# Patient Record
Sex: Female | Born: 1970 | Race: White | Hispanic: No | Marital: Single | Smoking: Former smoker
Health system: Southern US, Community
[De-identification: ages and names within clinical notes are randomized; demographics above are authoritative.]

## PROBLEM LIST (undated history)

## (undated) DIAGNOSIS — E785 Hyperlipidemia, unspecified: Secondary | ICD-10-CM

## (undated) DIAGNOSIS — M1990 Unspecified osteoarthritis, unspecified site: Secondary | ICD-10-CM

## (undated) DIAGNOSIS — J4520 Mild intermittent asthma, uncomplicated: Secondary | ICD-10-CM

## (undated) DIAGNOSIS — I10 Essential (primary) hypertension: Secondary | ICD-10-CM

## (undated) DIAGNOSIS — E1165 Type 2 diabetes mellitus with hyperglycemia: Secondary | ICD-10-CM

## (undated) HISTORY — DX: Type 2 diabetes mellitus with hyperglycemia: E11.65

## (undated) HISTORY — DX: Mild intermittent asthma, uncomplicated: J45.20

## (undated) HISTORY — PX: CHOLECYSTECTOMY: SHX55

---

## 1998-11-01 ENCOUNTER — Other Ambulatory Visit: Admission: RE | Admit: 1998-11-01 | Discharge: 1998-11-01 | Payer: Self-pay | Admitting: Family Medicine

## 2001-09-21 ENCOUNTER — Ambulatory Visit (HOSPITAL_COMMUNITY): Admission: RE | Admit: 2001-09-21 | Discharge: 2001-09-21 | Payer: Self-pay | Admitting: Obstetrics & Gynecology

## 2001-09-21 ENCOUNTER — Encounter: Payer: Self-pay | Admitting: Obstetrics & Gynecology

## 2002-12-02 ENCOUNTER — Other Ambulatory Visit: Admission: RE | Admit: 2002-12-02 | Discharge: 2002-12-02 | Payer: Self-pay | Admitting: Obstetrics & Gynecology

## 2008-03-06 ENCOUNTER — Emergency Department (HOSPITAL_COMMUNITY): Admission: EM | Admit: 2008-03-06 | Discharge: 2008-03-06 | Payer: Self-pay | Admitting: Emergency Medicine

## 2008-03-11 HISTORY — PX: ABDOMINAL SURGERY: SHX537

## 2008-04-15 ENCOUNTER — Ambulatory Visit (HOSPITAL_COMMUNITY): Admission: RE | Admit: 2008-04-15 | Discharge: 2008-04-15 | Payer: Self-pay | Admitting: Family Medicine

## 2008-12-01 ENCOUNTER — Ambulatory Visit (HOSPITAL_COMMUNITY): Admission: RE | Admit: 2008-12-01 | Discharge: 2008-12-01 | Payer: Self-pay | Admitting: Gastroenterology

## 2009-07-06 ENCOUNTER — Encounter: Admission: RE | Admit: 2009-07-06 | Discharge: 2009-07-06 | Payer: Self-pay | Admitting: Obstetrics & Gynecology

## 2010-06-08 ENCOUNTER — Other Ambulatory Visit: Payer: Self-pay | Admitting: Family Medicine

## 2010-06-08 DIAGNOSIS — Z1231 Encounter for screening mammogram for malignant neoplasm of breast: Secondary | ICD-10-CM

## 2010-07-13 ENCOUNTER — Ambulatory Visit
Admission: RE | Admit: 2010-07-13 | Discharge: 2010-07-13 | Disposition: A | Discharge status: Home / Self Care | Payer: PRIVATE HEALTH INSURANCE | Source: Ambulatory Visit | Attending: Family Medicine | Admitting: Family Medicine

## 2010-07-13 DIAGNOSIS — Z1231 Encounter for screening mammogram for malignant neoplasm of breast: Secondary | ICD-10-CM

## 2010-12-14 LAB — DIFFERENTIAL
Basophils Absolute: 0.1 10*3/uL (ref 0.0–0.1)
Basophils Relative: 1 % (ref 0–1)
Neutro Abs: 13.8 10*3/uL — ABNORMAL HIGH (ref 1.7–7.7)
Neutrophils Relative %: 76 % (ref 43–77)

## 2010-12-14 LAB — COMPREHENSIVE METABOLIC PANEL
Alkaline Phosphatase: 89 U/L (ref 39–117)
BUN: 7 mg/dL (ref 6–23)
Chloride: 104 mEq/L (ref 96–112)
Creatinine, Ser: 0.92 mg/dL (ref 0.4–1.2)
Glucose, Bld: 94 mg/dL (ref 70–99)
Potassium: 4.1 mEq/L (ref 3.5–5.1)
Total Bilirubin: 0.7 mg/dL (ref 0.3–1.2)

## 2010-12-14 LAB — LIPASE, BLOOD: Lipase: 23 U/L (ref 11–59)

## 2010-12-14 LAB — URINALYSIS, ROUTINE W REFLEX MICROSCOPIC
Glucose, UA: NEGATIVE mg/dL
Hgb urine dipstick: NEGATIVE
pH: 6 (ref 5.0–8.0)

## 2010-12-14 LAB — CBC
HCT: 42.5 % (ref 36.0–46.0)
Hemoglobin: 14.3 g/dL (ref 12.0–15.0)
MCV: 87.6 fL (ref 78.0–100.0)
RDW: 14 % (ref 11.5–15.5)

## 2010-12-14 LAB — GLUCOSE, CAPILLARY
Glucose-Capillary: 325 mg/dL — ABNORMAL HIGH (ref 70–99)
Glucose-Capillary: 325 mg/dL — ABNORMAL HIGH (ref 70–99)

## 2011-07-24 ENCOUNTER — Other Ambulatory Visit: Payer: Self-pay | Admitting: Obstetrics & Gynecology

## 2011-07-24 DIAGNOSIS — Z1231 Encounter for screening mammogram for malignant neoplasm of breast: Secondary | ICD-10-CM

## 2011-08-01 ENCOUNTER — Ambulatory Visit
Admission: RE | Admit: 2011-08-01 | Discharge: 2011-08-01 | Disposition: A | Discharge status: Home / Self Care | Payer: PRIVATE HEALTH INSURANCE | Source: Ambulatory Visit | Attending: Obstetrics & Gynecology | Admitting: Obstetrics & Gynecology

## 2011-08-01 DIAGNOSIS — Z1231 Encounter for screening mammogram for malignant neoplasm of breast: Secondary | ICD-10-CM

## 2012-09-25 ENCOUNTER — Other Ambulatory Visit: Payer: Self-pay

## 2012-09-25 DIAGNOSIS — Z1231 Encounter for screening mammogram for malignant neoplasm of breast: Secondary | ICD-10-CM

## 2012-10-12 ENCOUNTER — Ambulatory Visit
Admission: RE | Admit: 2012-10-12 | Discharge: 2012-10-12 | Disposition: A | Discharge status: Home / Self Care | Payer: PRIVATE HEALTH INSURANCE | Source: Ambulatory Visit

## 2012-10-12 DIAGNOSIS — Z1231 Encounter for screening mammogram for malignant neoplasm of breast: Secondary | ICD-10-CM

## 2013-01-02 ENCOUNTER — Emergency Department (HOSPITAL_BASED_OUTPATIENT_CLINIC_OR_DEPARTMENT_OTHER): Payer: PRIVATE HEALTH INSURANCE

## 2013-01-02 ENCOUNTER — Emergency Department (HOSPITAL_BASED_OUTPATIENT_CLINIC_OR_DEPARTMENT_OTHER)
Admission: EM | Admit: 2013-01-02 | Discharge: 2013-01-03 | Disposition: A | Discharge status: Home / Self Care | Payer: PRIVATE HEALTH INSURANCE | Attending: Emergency Medicine | Admitting: Emergency Medicine

## 2013-01-02 ENCOUNTER — Encounter (HOSPITAL_BASED_OUTPATIENT_CLINIC_OR_DEPARTMENT_OTHER): Payer: Self-pay | Admitting: Emergency Medicine

## 2013-01-02 DIAGNOSIS — Z79899 Other long term (current) drug therapy: Secondary | ICD-10-CM | POA: Insufficient documentation

## 2013-01-02 DIAGNOSIS — S8391XA Sprain of unspecified site of right knee, initial encounter: Secondary | ICD-10-CM

## 2013-01-02 DIAGNOSIS — Z880 Allergy status to penicillin: Secondary | ICD-10-CM | POA: Insufficient documentation

## 2013-01-02 DIAGNOSIS — I10 Essential (primary) hypertension: Secondary | ICD-10-CM | POA: Insufficient documentation

## 2013-01-02 DIAGNOSIS — Y9389 Activity, other specified: Secondary | ICD-10-CM | POA: Insufficient documentation

## 2013-01-02 DIAGNOSIS — Y929 Unspecified place or not applicable: Secondary | ICD-10-CM | POA: Insufficient documentation

## 2013-01-02 DIAGNOSIS — E785 Hyperlipidemia, unspecified: Secondary | ICD-10-CM | POA: Insufficient documentation

## 2013-01-02 DIAGNOSIS — M129 Arthropathy, unspecified: Secondary | ICD-10-CM | POA: Insufficient documentation

## 2013-01-02 DIAGNOSIS — IMO0002 Reserved for concepts with insufficient information to code with codable children: Secondary | ICD-10-CM | POA: Insufficient documentation

## 2013-01-02 DIAGNOSIS — X500XXA Overexertion from strenuous movement or load, initial encounter: Secondary | ICD-10-CM | POA: Insufficient documentation

## 2013-01-02 DIAGNOSIS — Z791 Long term (current) use of non-steroidal anti-inflammatories (NSAID): Secondary | ICD-10-CM | POA: Insufficient documentation

## 2013-01-02 HISTORY — DX: Hyperlipidemia, unspecified: E78.5

## 2013-01-02 HISTORY — DX: Essential (primary) hypertension: I10

## 2013-01-02 HISTORY — DX: Unspecified osteoarthritis, unspecified site: M19.90

## 2013-01-02 MED ORDER — HYDROCODONE-ACETAMINOPHEN 5-325 MG PO TABS: ORAL_TABLET | ORAL | Status: DC

## 2013-01-02 MED ORDER — ONDANSETRON 4 MG PO TBDP
ORAL_TABLET | ORAL | Status: AC
  Administered 2013-01-03: 4 mg
  Filled 2013-01-02: qty 1

## 2013-01-02 MED ORDER — PROMETHAZINE HCL 25 MG PO TABS: 25.0000 mg | ORAL_TABLET | Freq: Four times a day (QID) | ORAL | Status: DC | PRN

## 2013-01-02 MED ORDER — HYDROCODONE-ACETAMINOPHEN 5-325 MG PO TABS
2.0000 | ORAL_TABLET | Freq: Once | ORAL | Status: AC
  Administered 2013-01-02: 2 via ORAL
  Filled 2013-01-02: qty 2

## 2013-01-02 NOTE — ED Notes (Signed)
Patient was reluctant to move from chair to bed. I explained need for her on bed to form fit the immobilizer to her leg. I got her to agree, then tried both small and large sizes. I convinced patient to go with the larger device. I formed the "stay bars" to her leg's contour, then cut foam padding to a perfect fit. Patient is resting in bed, waiting on x-ray, she is requesting my assistance at time of discharge.

## 2013-01-02 NOTE — Discharge Instructions (Signed)
 Knee Pain The knee is the complex joint between your thigh and your lower leg. It is made up of bones, tendons, ligaments, and cartilage. The bones that make up the knee are:  The femur in the thigh.  The tibia and fibula in the lower leg.  The patella or kneecap riding in the groove on the lower femur. CAUSES  Knee pain is a common complaint with many causes. A few of these causes are:  Injury, such as:  A ruptured ligament or tendon injury.  Torn cartilage.  Medical conditions, such as:  Gout  Arthritis  Infections  Overuse, over training or overdoing a physical activity. Knee pain can be minor or severe. Knee pain can accompany debilitating injury. Minor knee problems often respond well to self-care measures or get well on their own. More serious injuries may need medical intervention or even surgery. SYMPTOMS The knee is complex. Symptoms of knee problems can vary widely. Some of the problems are:  Pain with movement and weight bearing.  Swelling and tenderness.  Buckling of the knee.  Inability to straighten or extend your knee.  Your knee locks and you cannot straighten it.  Warmth and redness with pain and fever.  Deformity or dislocation of the kneecap. DIAGNOSIS  Determining what is wrong may be very straight forward such as when there is an injury. It can also be challenging because of the complexity of the knee. Tests to make a diagnosis may include:  Your caregiver taking a history and doing a physical exam.  Routine X-rays can be used to rule out other problems. X-rays will not reveal a cartilage tear. Some injuries of the knee can be diagnosed by:  Arthroscopy a surgical technique by which a small video camera is inserted through tiny incisions on the sides of the knee. This procedure is used to examine and repair internal knee joint problems. Tiny instruments can be used during arthroscopy to repair the torn knee cartilage (meniscus).  Arthrography  is a radiology technique. A contrast liquid is directly injected into the knee joint. Internal structures of the knee joint then become visible on X-ray film.  An MRI scan is a non x-ray radiology procedure in which magnetic fields and a computer produce two- or three-dimensional images of the inside of the knee. Cartilage tears are often visible using an MRI scanner. MRI scans have largely replaced arthrography in diagnosing cartilage tears of the knee.  Blood work.  Examination of the fluid that helps to lubricate the knee joint (synovial fluid). This is done by taking a sample out using a needle and a syringe. TREATMENT The treatment of knee problems depends on the cause. Some of these treatments are:  Depending on the injury, proper casting, splinting, surgery or physical therapy care will be needed.  Give yourself adequate recovery time. Do not overuse your joints. If you begin to get sore during workout routines, back off. Slow down or do fewer repetitions.  For repetitive activities such as cycling or running, maintain your strength and nutrition.  Alternate muscle groups. For example if you are a weight lifter, work the upper body on one day and the lower body the next.  Either tight or weak muscles do not give the proper support for your knee. Tight or weak muscles do not absorb the stress placed on the knee joint. Keep the muscles surrounding the knee strong.  Take care of mechanical problems.  If you have flat feet, orthotics or special shoes may help.  See your caregiver if you need help.  Arch supports, sometimes with wedges on the inner or outer aspect of the heel, can help. These can shift pressure away from the side of the knee most bothered by osteoarthritis.  A brace called an unloader brace also may be used to help ease the pressure on the most arthritic side of the knee.  If your caregiver has prescribed crutches, braces, wraps or ice, use as directed. The acronym for  this is PRICE. This means protection, rest, ice, compression and elevation.  Nonsteroidal anti-inflammatory drugs (NSAID's), can help relieve pain. But if taken immediately after an injury, they may actually increase swelling. Take NSAID's with food in your stomach. Stop them if you develop stomach problems. Do not take these if you have a history of ulcers, stomach pain or bleeding from the bowel. Do not take without your caregiver's approval if you have problems with fluid retention, heart failure, or kidney problems.  For ongoing knee problems, physical therapy may be helpful.  Glucosamine and chondroitin are over-the-counter dietary supplements. Both may help relieve the pain of osteoarthritis in the knee. These medicines are different from the usual anti-inflammatory drugs. Glucosamine may decrease the rate of cartilage destruction.  Injections of a corticosteroid drug into your knee joint may help reduce the symptoms of an arthritis flare-up. They may provide pain relief that lasts a few months. You may have to wait a few months between injections. The injections do have a small increased risk of infection, water retention and elevated blood sugar levels.  Hyaluronic acid injected into damaged joints may ease pain and provide lubrication. These injections may work by reducing inflammation. A series of shots may give relief for as long as 6 months.  Topical painkillers. Applying certain ointments to your skin may help relieve the pain and stiffness of osteoarthritis. Ask your pharmacist for suggestions. Many over the-counter products are approved for temporary relief of arthritis pain.  In some countries, doctors often prescribe topical NSAID's for relief of chronic conditions such as arthritis and tendinitis. A review of treatment with NSAID creams found that they worked as well as oral medications but without the serious side effects. PREVENTION  Maintain a healthy weight. Extra pounds put  more strain on your joints.  Get strong, stay limber. Weak muscles are a common cause of knee injuries. Stretching is important. Include flexibility exercises in your workouts.  Be smart about exercise. If you have osteoarthritis, chronic knee pain or recurring injuries, you may need to change the way you exercise. This does not mean you have to stop being active. If your knees ache after jogging or playing basketball, consider switching to swimming, water aerobics or other low-impact activities, at least for a few days a week. Sometimes limiting high-impact activities will provide relief.  Make sure your shoes fit well. Choose footwear that is right for your sport.  Protect your knees. Use the proper gear for knee-sensitive activities. Use kneepads when playing volleyball or laying carpet. Buckle your seat belt every time you drive. Most shattered kneecaps occur in car accidents.  Rest when you are tired. SEEK MEDICAL CARE IF:  You have knee pain that is continual and does not seem to be getting better.  SEEK IMMEDIATE MEDICAL CARE IF:  Your knee joint feels hot to the touch and you have a high fever. MAKE SURE YOU:   Understand these instructions.  Will watch your condition.  Will get help right away if you are not  doing well or get worse. Document Released: 12/23/2006 Document Revised: 05/20/2011 Document Reviewed: 12/23/2006 [facility] Patient Information 2014 [place], [place].     Narcotic and benzodiazepine use may cause drowsiness, slowed breathing or dependence.  Please use with caution and do not drive, operate machinery or watch young children alone while taking them.  Taking combinations of these medications or drinking alcohol will potentiate these effects.

## 2013-01-02 NOTE — ED Notes (Signed)
Pt report fall stepping over boxes and twisted right knee and right buttocks pain radiates  down back off buttock to lower leg

## 2013-01-02 NOTE — ED Provider Notes (Signed)
CSN: [id number]     Arrival date & time 01/02/13  1928 History  This chart was scribed for Monique Fisher. Oletta Lamas, MD by Danella Maiers, ED Scribe. This patient was seen in room MH06/MH06 and the patient's care was started at 9:32 PM.   Chief Complaint  Patient presents with  . Knee Injury   The history is provided by the patient. No language interpreter was used.   HPI Comments: Monique Fisher is a 42 y.o. female who presents to the Emergency Department complaining of constant, sudden-onset right knee pain after catching herself from falling while stepping over storage boxes and twisting her right knee around 11am this morning. She was unable to bear weight on her right leg imemdiately after the accident. Sine then she has been able to limp with occasional buckling of the right leg. She denies hitting her head, LOC, injury anywhere else.  Pt had not taken any specific medications prior to arrival.  Bending, twisting leg causes pain to worsen as does trying to bear weight.    Past Medical History  Diagnosis Date  . Hypertension   . Hyperlipemia   . Arthritis    Past Surgical History  Procedure Laterality Date  . Abdominal surgery    . Cholecystectomy     History reviewed. No pertinent family history. History  Substance Use Topics  . Smoking status: Never Smoker   . Smokeless tobacco: Not on file  . Alcohol Use: No   OB History   Grav Para Term Preterm Abortions TAB SAB Ect Mult Living                 Review of Systems  Musculoskeletal: Positive for arthralgias (right knee) and joint swelling. Negative for back pain.  Skin: Negative for color change and wound.  Neurological: Negative for weakness and numbness.    Allergies  Iohexol; Penicillins; and Sulfa antibiotics  Home Medications   Current Outpatient Rx  Name  Route  Sig  Dispense  Refill  . cholecalciferol (VITAMIN D) 1000 UNITS tablet   Oral   Take 2,000 Units by mouth daily.         . fenofibrate 54 MG tablet  Oral   Take 54 mg by mouth daily.         Marland Kitchen gabapentin (NEURONTIN) 300 MG capsule   Oral   Take 300 mg by mouth 2 (two) times daily with a meal.         . losartan (COZAAR) 100 MG tablet   Oral   Take 100 mg by mouth daily.         . meloxicam (MOBIC) 15 MG tablet   Oral   Take 15 mg by mouth daily.         . metoprolol succinate (TOPROL-XL) 50 MG 24 hr tablet   Oral   Take 50 mg by mouth daily. Take with or immediately following a meal.         . montelukast (SINGULAIR) 10 MG tablet   Oral   Take 10 mg by mouth at bedtime.         . Multiple Vitamins-Minerals (MULTIVITAMIN WITH MINERALS) tablet   Oral   Take 1 tablet by mouth daily.         Marland Kitchen OLANZapine (ZYPREXA) 10 MG tablet   Oral   Take 10 mg by mouth at bedtime.         Marland Kitchen omeprazole (PRILOSEC) 20 MG capsule   Oral   Take 20 mg  by mouth daily.         Marland Kitchen pyridOXINE (VITAMIN B-6) 100 MG tablet   Oral   Take 100 mg by mouth daily.         . vitamin B-12 (CYANOCOBALAMIN) 1000 MCG tablet   Oral   Take 1,000 mcg by mouth daily.         Marland Kitchen HYDROcodone-acetaminophen (NORCO/VICODIN) 5-325 MG per tablet      1-2 tablets po q 6 hours prn moderate to severe pain   20 tablet   0    BP 151/77  Pulse 98  Temp(Src) 98.3 F (36.8 C) (Oral)  Resp 20  Ht 5\' 3"  (1.6 m)  Wt 304 lb (137.893 kg)  BMI 53.86 kg/m2  SpO2 98% Physical Exam  Nursing note and vitals reviewed. Constitutional: She is oriented to person, place, and time. She appears well-developed and well-nourished. No distress.  HENT:  Head: Normocephalic and atraumatic.  Eyes: EOM are normal.  Neck: Neck supple. No tracheal deviation present.  Cardiovascular: Normal rate and regular rhythm.   Pulmonary/Chest: Effort normal. No respiratory distress.  Abdominal: Soft.  Musculoskeletal:       Right knee: She exhibits decreased range of motion. She exhibits no effusion, no ecchymosis, no deformity, no laceration, no erythema, normal  alignment, no LCL laxity, normal patellar mobility and no bony tenderness. Tenderness found. Medial joint line and lateral joint line tenderness noted. No patellar tendon tenderness noted.  quadriceps and hamstrings intact although pain with extending the knee. Ligaments in all four directions seem stable. Limited by her body habitus.  Neurological: She is alert and oriented to person, place, and time.  Skin: Skin is warm and dry. No lesion and no rash noted. She is not diaphoretic. No cyanosis or erythema. No pallor.  Color was normal.  Psychiatric: She has a normal mood and affect. Her behavior is normal.    ED Course  Procedures (including critical care time) Medications  HYDROcodone-acetaminophen (NORCO/VICODIN) 5-325 MG per tablet 2 tablet (2 tablets Oral Given 01/02/13 2214)    DIAGNOSTIC STUDIES: Oxygen Saturation is 98% on RA, normal by my interpretation.    COORDINATION OF CARE: 9:53 PM- Discussed treatment plan with pt which includes x-rays, knee immobilizer, and pain medication. Pt agrees to plan.    Labs Review Labs Reviewed - No data to display Imaging Review Dg Knee Complete 4 Views Right  01/02/2013   CLINICAL DATA:  Knee injury. Twisted knee. Pain inferior to the patella.  EXAM: RIGHT KNEE - COMPLETE 4+ VIEW  COMPARISON:  None.  FINDINGS: The alignment of the knee is anatomic. There is no acute fracture are identified. There is an old avulsion of the tip of a fibular head. This appears well corticated. Knee effusion is difficult to evaluate because of body habitus. Patellar tendon shadow appears within normal limits. Mild patellofemoral osteoarthritis is present with marginal osteophytes.  IMPRESSION: No acute osseous abnormality.   Electronically Signed   By: Andreas Newport M.D.   On: 01/02/2013 23:13    EKG Interpretation   None       MDM   1. Knee sprain and strain, right, initial encounter      I personally performed the services described in this  documentation, which was scribed in my presence. The recorded information has been reviewed and considered.  Pt with twisting injury to knee, possibly ligamentous injury.  PT can bear some weight so doubt there is fracture.  Will get plain films, treat with  RICE, Rx for analgesics, refer to ortho for follow up, crutcehs, knee immobilizer  Monique Fisher. Oletta Lamas, MD 01/02/13 2330

## 2013-01-03 NOTE — ED Notes (Signed)
Pt c/o feeling nauseous, Dr. Oletta Lamas notified and order received for Zofran 4 mg ODT.

## 2013-11-04 DIAGNOSIS — G5710 Meralgia paresthetica, unspecified lower limb: Secondary | ICD-10-CM | POA: Insufficient documentation

## 2013-11-04 DIAGNOSIS — G5600 Carpal tunnel syndrome, unspecified upper limb: Secondary | ICD-10-CM | POA: Insufficient documentation

## 2014-06-07 ENCOUNTER — Other Ambulatory Visit: Payer: Self-pay

## 2014-06-07 DIAGNOSIS — Z1231 Encounter for screening mammogram for malignant neoplasm of breast: Secondary | ICD-10-CM

## 2014-06-09 ENCOUNTER — Ambulatory Visit: Payer: PRIVATE HEALTH INSURANCE

## 2014-06-21 ENCOUNTER — Ambulatory Visit
Admission: RE | Admit: 2014-06-21 | Discharge: 2014-06-21 | Disposition: A | Discharge status: Home / Self Care | Payer: PRIVATE HEALTH INSURANCE | Source: Ambulatory Visit

## 2014-06-21 DIAGNOSIS — Z1231 Encounter for screening mammogram for malignant neoplasm of breast: Secondary | ICD-10-CM

## 2014-06-22 ENCOUNTER — Other Ambulatory Visit: Payer: Self-pay | Admitting: Obstetrics & Gynecology

## 2014-06-22 DIAGNOSIS — R928 Other abnormal and inconclusive findings on diagnostic imaging of breast: Secondary | ICD-10-CM

## 2014-06-24 ENCOUNTER — Ambulatory Visit
Admission: RE | Admit: 2014-06-24 | Discharge: 2014-06-24 | Disposition: A | Discharge status: Home / Self Care | Payer: PRIVATE HEALTH INSURANCE | Source: Ambulatory Visit | Attending: Obstetrics & Gynecology | Admitting: Obstetrics & Gynecology

## 2014-06-24 DIAGNOSIS — R928 Other abnormal and inconclusive findings on diagnostic imaging of breast: Secondary | ICD-10-CM

## 2015-01-05 ENCOUNTER — Other Ambulatory Visit: Payer: Self-pay | Admitting: Allergy and Immunology

## 2015-01-12 ENCOUNTER — Encounter: Payer: Self-pay | Admitting: Allergy and Immunology

## 2015-01-12 ENCOUNTER — Ambulatory Visit (INDEPENDENT_AMBULATORY_CARE_PROVIDER_SITE_OTHER): Payer: BLUE CROSS/BLUE SHIELD | Admitting: Allergy and Immunology

## 2015-01-12 VITALS — BP 114/66 | HR 76 | Resp 16

## 2015-01-12 DIAGNOSIS — R062 Wheezing: Secondary | ICD-10-CM | POA: Diagnosis not present

## 2015-01-12 DIAGNOSIS — H1010 Acute atopic conjunctivitis, unspecified eye: Secondary | ICD-10-CM | POA: Diagnosis not present

## 2015-01-12 DIAGNOSIS — R05 Cough: Secondary | ICD-10-CM

## 2015-01-12 DIAGNOSIS — J309 Allergic rhinitis, unspecified: Secondary | ICD-10-CM

## 2015-01-12 DIAGNOSIS — R059 Cough, unspecified: Secondary | ICD-10-CM

## 2015-01-12 MED ORDER — MONTELUKAST SODIUM 10 MG PO TABS: ORAL_TABLET | ORAL | Status: DC

## 2015-01-12 NOTE — Progress Notes (Signed)
FOLLOW UP NOTE  RE: Gursimran Litaker MRN: [id number] DOB: [birth year] [facility] [place] [address] [place] [address] Date of Office Visit: 01/12/2015  Subjective:  Monique Fisher is a 44 y.o. female who presents today follow-up of nasal symptoms and recent UC visit.   HPI: Lashon returns to office in follow-up of allergic rhinoconjunctivitis, cough, and food allergy. Since her last visit,  she feels maintenance medications are working well for her, though she had upper respiratory infection like symptoms of cough, congestion.  throat irritation, and postnasal drip, after getting stuck in the rain for 2 hours.  She managed at home initially with Delsym, which did not seem beneficial and about day 5 saw primary M.D. for chest congestion, wheezing at the walk-in clinic.  They prescribed Levaquin, a cough syrup and 3 days of Pro Air use.  Two days prior to her symptoms, she had evaluation with neurology related to arm, hand numbness and associated carpal tunnel issue, which was treated with prednisone.  (One of her concerns was the acute symptoms while taking prednisone).  She currently feels she is 100% for over a week now.  Current Medications: 1.  Pro Air HFA as needed. 2.  Cetirizine 10 mg daily. 3.  Singulair 10 mg daily. 4.  Benadryl/EpiPen as needed. 5.  Saline nasal lavage as needed. 6.  Pataday and Nasonex as needed (none recently). 7.  Continues norethindrone, Toprol-XL, glucosamine chondroitin, Prilosec, fenofibrate, gabapentin, losartan, culturelle probiotic, multivitamin, vitamin B-12, vitamin D and B6.  Drug Allergies: Allergies  Allergen Reactions  . Amlodipine     Other reaction(s): NO REACTION  . Amoxicillin   . Erythromycin     "SKIN CRAWLING"  . Fluticasone   . Iohexol      Code: HIVES, Desc: pt states hive w/ct contrast, does fine w/13hr prep, Onset Date: [id number]  Code: HIVES, Desc: pt states hive w/ct contrast,  does fine w/13hr prep, Onset Date: [id number]   . Latex   . Lisinopril     NIGHT SWEATS, COUGH   . Penicillins   . Phentermine Hypertension  . Shellfish-Derived Products   . Sulfa Antibiotics     Objective:   Filed Vitals:   01/12/15 1037  BP: 114/66  Pulse: 76  Resp: 16   Physical Exam  Constitutional: She is well-developed, well-nourished, and in no distress.  HENT:  Head: Atraumatic.  Right Ear: Tympanic membrane and ear canal normal.  Left Ear: Tympanic membrane and ear canal normal.  Nose: Mucosal edema present. No rhinorrhea. No epistaxis.  Mouth/Throat: Oropharynx is clear and moist and mucous membranes are normal. No oropharyngeal exudate, posterior oropharyngeal edema or posterior oropharyngeal erythema.  Neck: Neck supple.  Cardiovascular: Normal rate, S1 normal and S2 normal.   No murmur heard. Pulmonary/Chest: Effort normal. She has no wheezes. She has no rhonchi. She has no rales.  Lymphadenopathy:    She has no cervical adenopathy.    Diagnostics: FVC 2.87--93%, FEV1 2.46--93%.  Assessment:  1.  Allergic rhinoconjunctivitis, well controlled from recent exacerbation. 2.  Patient report of recent respiratory infection with wheezing status post Levaquin and ProAir. 3.  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 4.  Complex medical history on multiple medication regime. Plan:  1.  Nachelle will continue her current medication regime is working well for her. 2.  Follow-up in 6 months or sooner if needed. 3.  She will follow up earlier with neurology as discussed.  Roselyn M. Willa RoughHicks, MD  cc: Dario Guardianandace T. Smith, MD

## 2015-06-03 ENCOUNTER — Other Ambulatory Visit: Payer: Self-pay | Admitting: Allergy and Immunology

## 2015-06-09 ENCOUNTER — Other Ambulatory Visit: Payer: Self-pay

## 2015-06-09 MED ORDER — EPINEPHRINE 0.3 MG/0.3ML IJ SOAJ: INTRAMUSCULAR | Status: DC

## 2015-06-09 MED ORDER — EPINEPHRINE 0.3 MG/0.3ML IJ SOAJ
INTRAMUSCULAR | Status: DC
Start: 2015-06-09 — End: 2015-06-09

## 2015-06-09 NOTE — Telephone Encounter (Signed)
Patient called states has coupon to get Epipen brand name for free. Sent in to pharmacy.

## 2015-06-09 NOTE — Telephone Encounter (Signed)
Pt would like the Brand name Epi Pen sent in, instead of the generic. She would  like to use the brand name coupon.   [facility] [address] [facility] in [place]   Please Advise  DOL Visit 01/2015 HICKS

## 2015-06-09 NOTE — Addendum Note (Signed)
Addended by: Bennye AlmMIRANDA, Dru Laurel on: 06/09/2015 03:31 PM   Modules accepted: Orders

## 2015-07-13 ENCOUNTER — Ambulatory Visit: Payer: BLUE CROSS/BLUE SHIELD | Admitting: Allergy and Immunology

## 2015-07-17 ENCOUNTER — Other Ambulatory Visit: Payer: Self-pay

## 2015-07-17 DIAGNOSIS — Z1231 Encounter for screening mammogram for malignant neoplasm of breast: Secondary | ICD-10-CM

## 2015-07-28 ENCOUNTER — Encounter: Payer: Self-pay | Admitting: Allergy and Immunology

## 2015-07-28 ENCOUNTER — Ambulatory Visit
Admission: RE | Admit: 2015-07-28 | Discharge: 2015-07-28 | Disposition: A | Discharge status: Home / Self Care | Payer: Managed Care, Other (non HMO) | Source: Ambulatory Visit

## 2015-07-28 ENCOUNTER — Other Ambulatory Visit: Payer: Self-pay

## 2015-07-28 ENCOUNTER — Ambulatory Visit (INDEPENDENT_AMBULATORY_CARE_PROVIDER_SITE_OTHER): Payer: Managed Care, Other (non HMO) | Admitting: Allergy and Immunology

## 2015-07-28 VITALS — BP 120/75 | HR 76 | Temp 98.0°F | Resp 16

## 2015-07-28 DIAGNOSIS — R062 Wheezing: Secondary | ICD-10-CM | POA: Diagnosis not present

## 2015-07-28 DIAGNOSIS — Z1231 Encounter for screening mammogram for malignant neoplasm of breast: Secondary | ICD-10-CM

## 2015-07-28 DIAGNOSIS — R059 Cough, unspecified: Secondary | ICD-10-CM

## 2015-07-28 DIAGNOSIS — J309 Allergic rhinitis, unspecified: Secondary | ICD-10-CM | POA: Diagnosis not present

## 2015-07-28 DIAGNOSIS — H1010 Acute atopic conjunctivitis, unspecified eye: Secondary | ICD-10-CM

## 2015-07-28 DIAGNOSIS — R05 Cough: Secondary | ICD-10-CM | POA: Diagnosis not present

## 2015-07-28 MED ORDER — MONTELUKAST SODIUM 10 MG PO TABS: ORAL_TABLET | ORAL | Status: DC

## 2015-07-28 NOTE — Progress Notes (Addendum)
FOLLOW UP NOTE  RE: Monique Fisher MRN: [id number] DOB: 16-May-1970 [facility] [place] [address] [place] [id number] Date of Office Visit: 07/28/2015  Subjective:  Monique Fisher is a 45 y.o. female who presents today for Allergic Rhinitis   Assessment:   1. Allergic rhinoconjunctivitis   2. Cough   3. Wheeze    Plan:   Meds ordered this encounter  Medications  . montelukast (SINGULAIR) 10 MG tablet    Sig: TAKE ONE TABLET BY MOUTH ONCE DAILY IN THE EVENING TO  PREVENT  COUGH,  WHEEZE,  OR  CONGESTION    Dispense:  30 tablet    Refill:  4   Patient Instructions  1.  Continue current medication regime-- (Singulair and Nasonex) consider trial of Xyzal 5 mg once daily in place of Zyrtec if patient desires.  (Coupon given today for over the counter). 2.  Saline nasal wash each evening at shower time and as needed. 3.  Call with new/recurring concerns or ProAir use. 4.  Follow-up in 6-9 months or sooner if needed.  HPI: Monique Fisher returns to the office in follow-up of allergic rhinoconjunctivitis, cough and wheeze.  She is very pleased with how well she done since her last visit in November and feels medications are beneficial.  Slightly over 2 weeks ago, she had increasing congestion, drainage, decreased energy and significant head congestion, without headache, fever, sore throat.  She was seeing her neurologist, Dr. Hyacinth MeekerMiller, who prescribed Keflex for a "sinus infection".  She feels significant improvement >95% and did not have any chest symptoms during this difficulty where she felt Mucinex, saline and adding Nasonex consistently was beneficial.  She denies any fever, headache, sore throat, any persisting rhinorrhea, sneezing, itchy, watery eyes or drainage.  She feels the preventive regime is working well for her and denies any recent ProAir use.  No other questions or concerns.  Denies ED or urgent care visits, prednisone courses. Reports sleep and activity  are normal.  She is considering trying Xyzal OTC  in place of Zyrtec.  Continues to avoid shellfish without difficulty.  No EpiPen use.  Monique Fisher has a current medication list which includes the following prescription(s): acetaminophen, albuterol sulfate, aspirin, benzoyl peroxide, butalbital-acetaminophen-caffeine, cetirizine, cholecalciferol, diphenhydramine, epinephrine, fenofibrate, gabapentin, losartan, metoprolol succinate, misc natural products, mometasone, montelukast, multivitamin with minerals, norethindrone, olopatadine hcl, omeprazole, pyridoxine, sodium chloride, vitamin b-12.   Drug Allergies: Allergies  Allergen Reactions  . Amlodipine     Other reaction(s): NO REACTION  . Amoxicillin   . Erythromycin     "SKIN CRAWLING"  . Fluticasone   . Iohexol      Code: HIVES, Desc: pt states hive w/ct contrast, does fine w/13hr prep, Onset Date: [id number]  Code: HIVES, Desc: pt states hive w/ct contrast, does fine w/13hr prep, Onset Date: [id number]   . Latex   . Lisinopril     NIGHT SWEATS, COUGH   . Penicillins   . Phentermine Hypertension  . Shellfish-Derived Products   . Sulfa Antibiotics    Objective:   Filed Vitals:   07/28/15 0953  BP: 120/75  Pulse: 76  Temp: 98 F (36.7 C)  Resp: 16   SpO2 Readings from Last 1 Encounters:  07/28/15 98%   Physical Exam  Constitutional: She is well-developed, well-nourished, and in no distress.  HENT:  Head: Atraumatic.  Right Ear: Tympanic membrane and ear canal normal.  Left Ear: Tympanic membrane and ear canal normal.  Nose: Mucosal edema  present. No rhinorrhea. No epistaxis.  Mouth/Throat: Oropharynx is clear and moist and mucous membranes are normal. No oropharyngeal exudate, posterior oropharyngeal edema or posterior oropharyngeal erythema.  Neck: Neck supple.  Cardiovascular: Normal rate, S1 normal and S2 normal.   No murmur heard. Pulmonary/Chest: Effort normal. She has no wheezes. She has no rhonchi. She has no rales.    Lymphadenopathy:    She has no cervical adenopathy.   Diagnostics: Spirometry:  FVC 3.07--94%, FEV1 2.61--96%.    Roselyn M. Willa Rough, MD  cc: Allean Found, MD

## 2015-07-28 NOTE — Patient Instructions (Signed)
  Continue current medication regime.  Saline nasal wash each evening and as needed.  Call with new/recurring concerns or ProAir use.  Follow-up in 6-9 months or sooner if needed.

## 2015-09-07 IMAGING — CR DG KNEE COMPLETE 4+V*R*
4 series · 4 of 4 positions shown · non-contrast
Comparison: None.

CLINICAL DATA: Knee injury. Twisted knee. Pain inferior to the
patella.

EXAM:
RIGHT KNEE - COMPLETE 4+ VIEW

[t knee ap right]
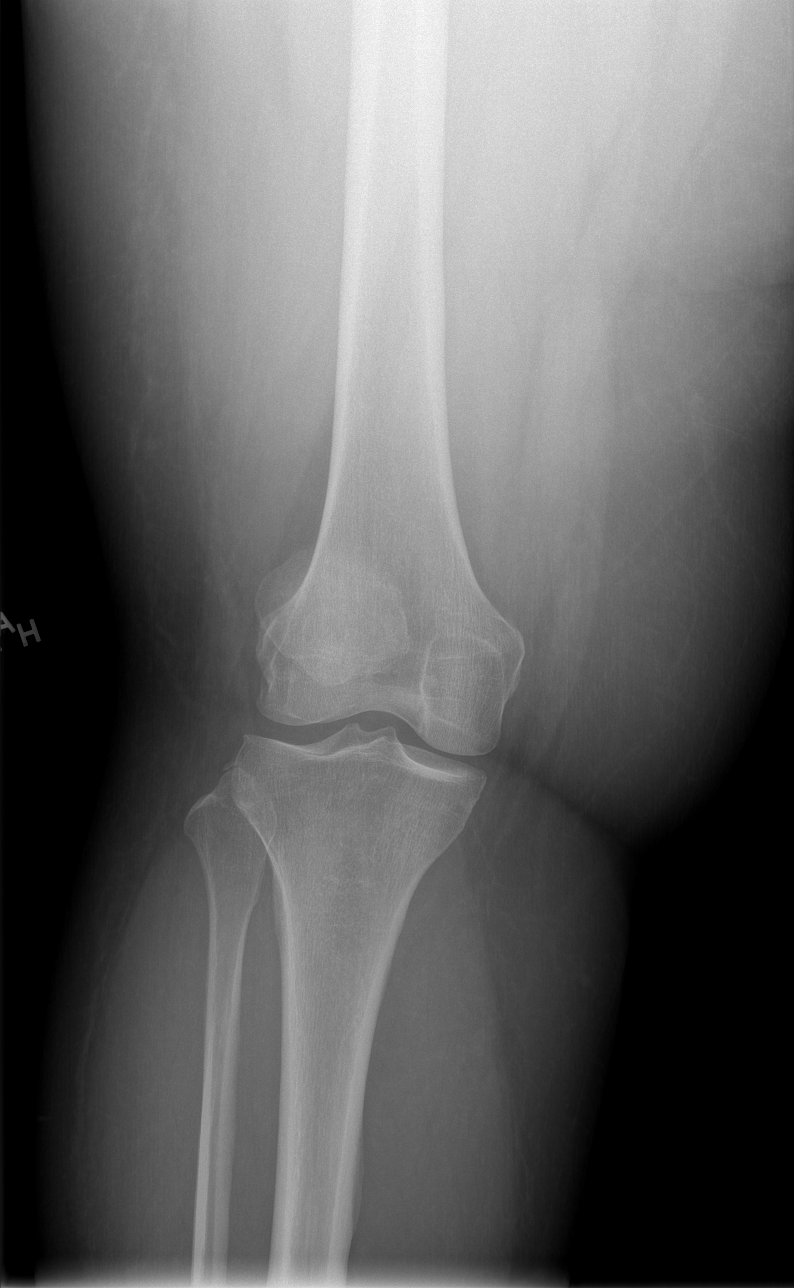

[t knee oblique right (1 of 2)]
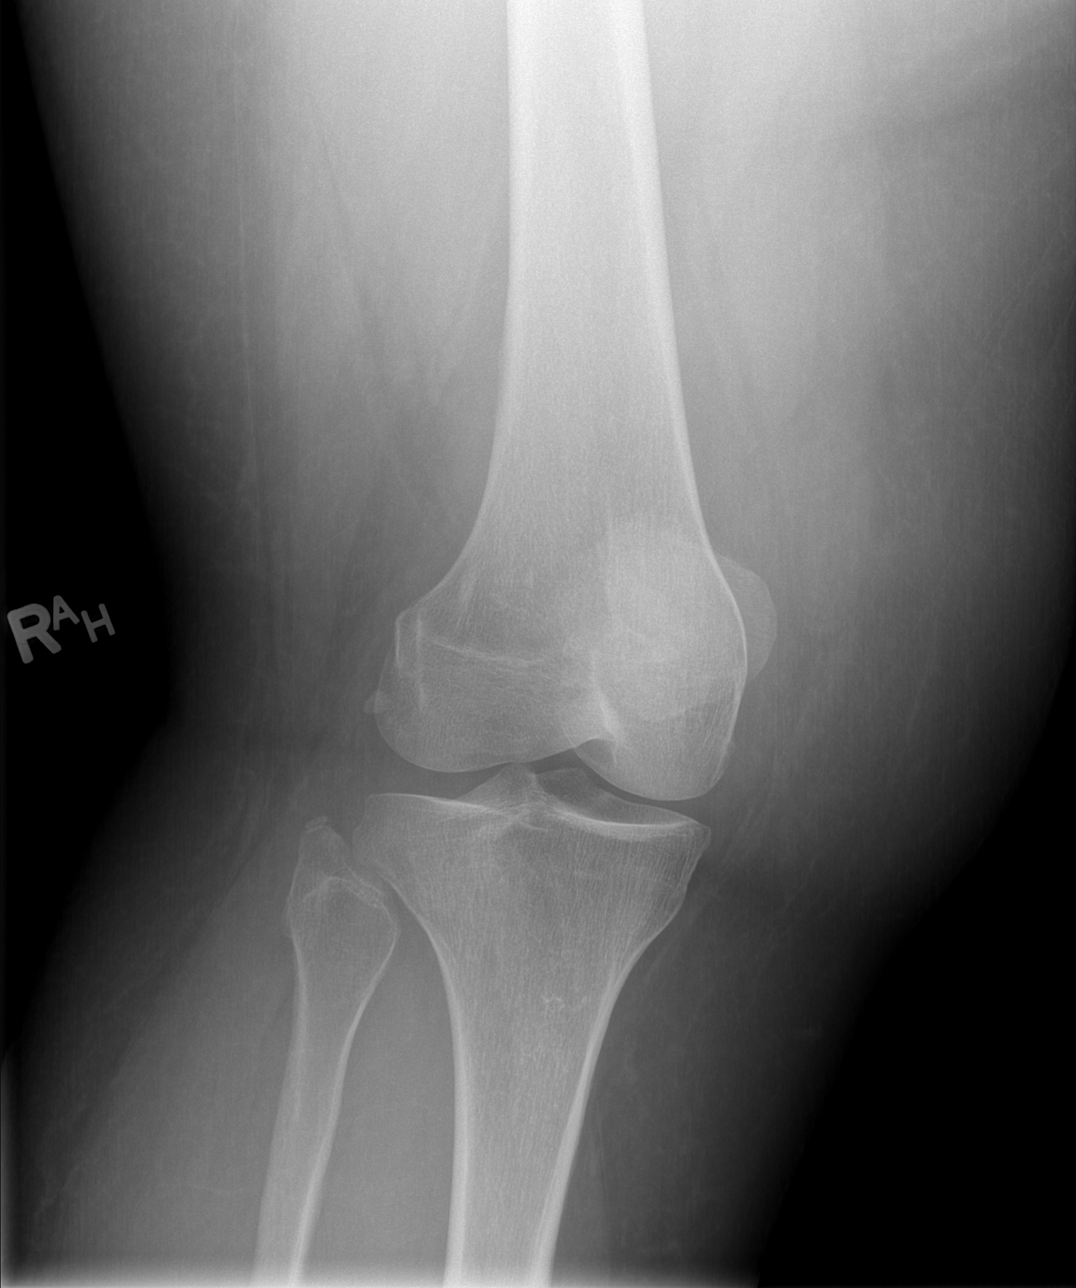

[t knee oblique right (2 of 2)]
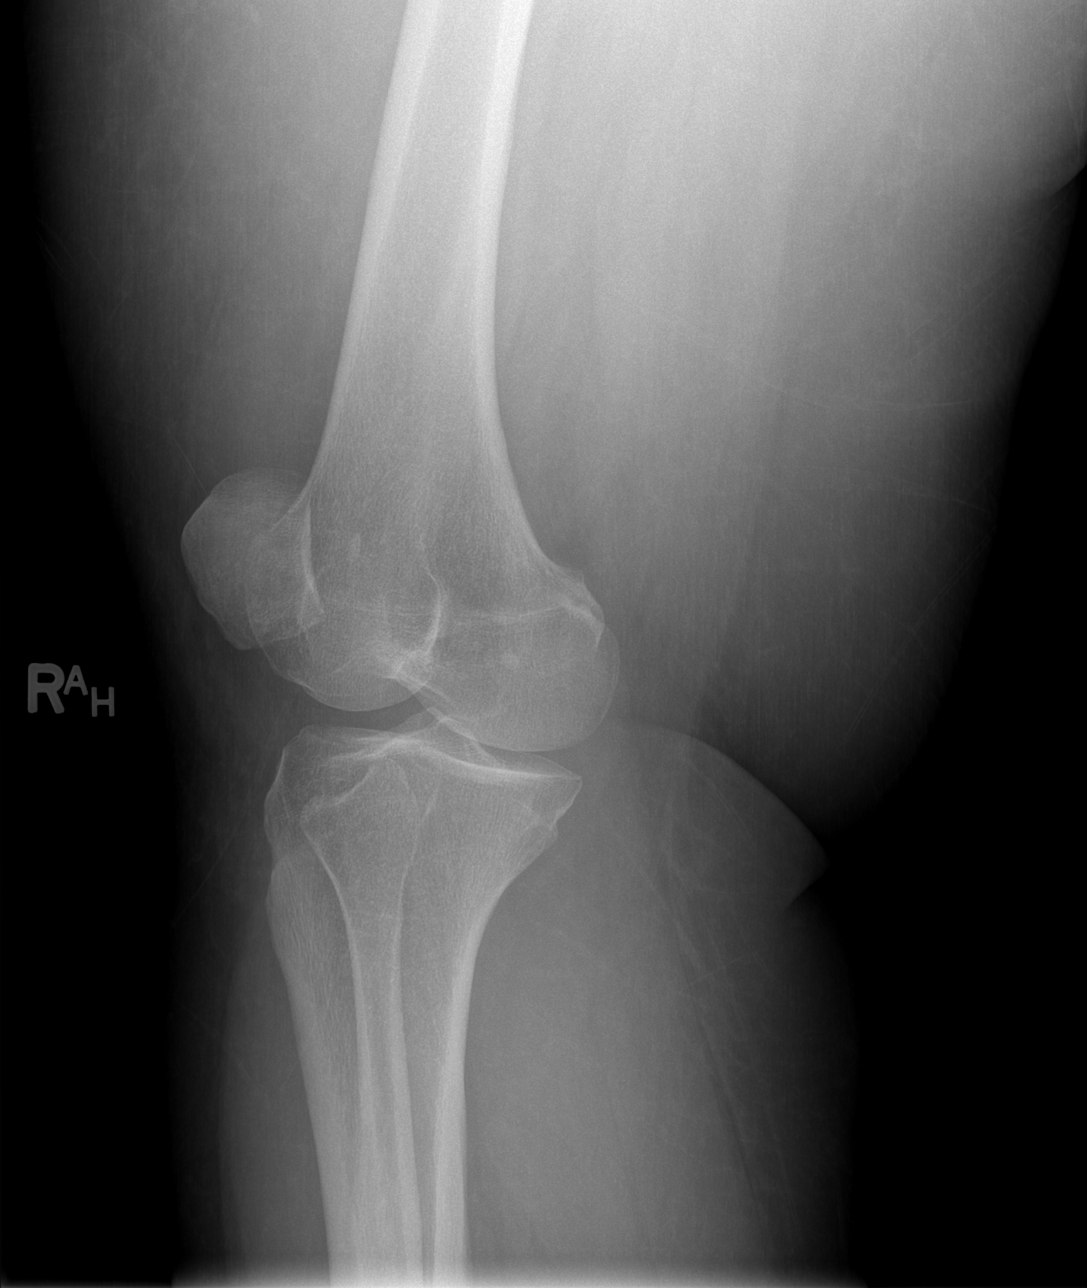

[t knee lat right]
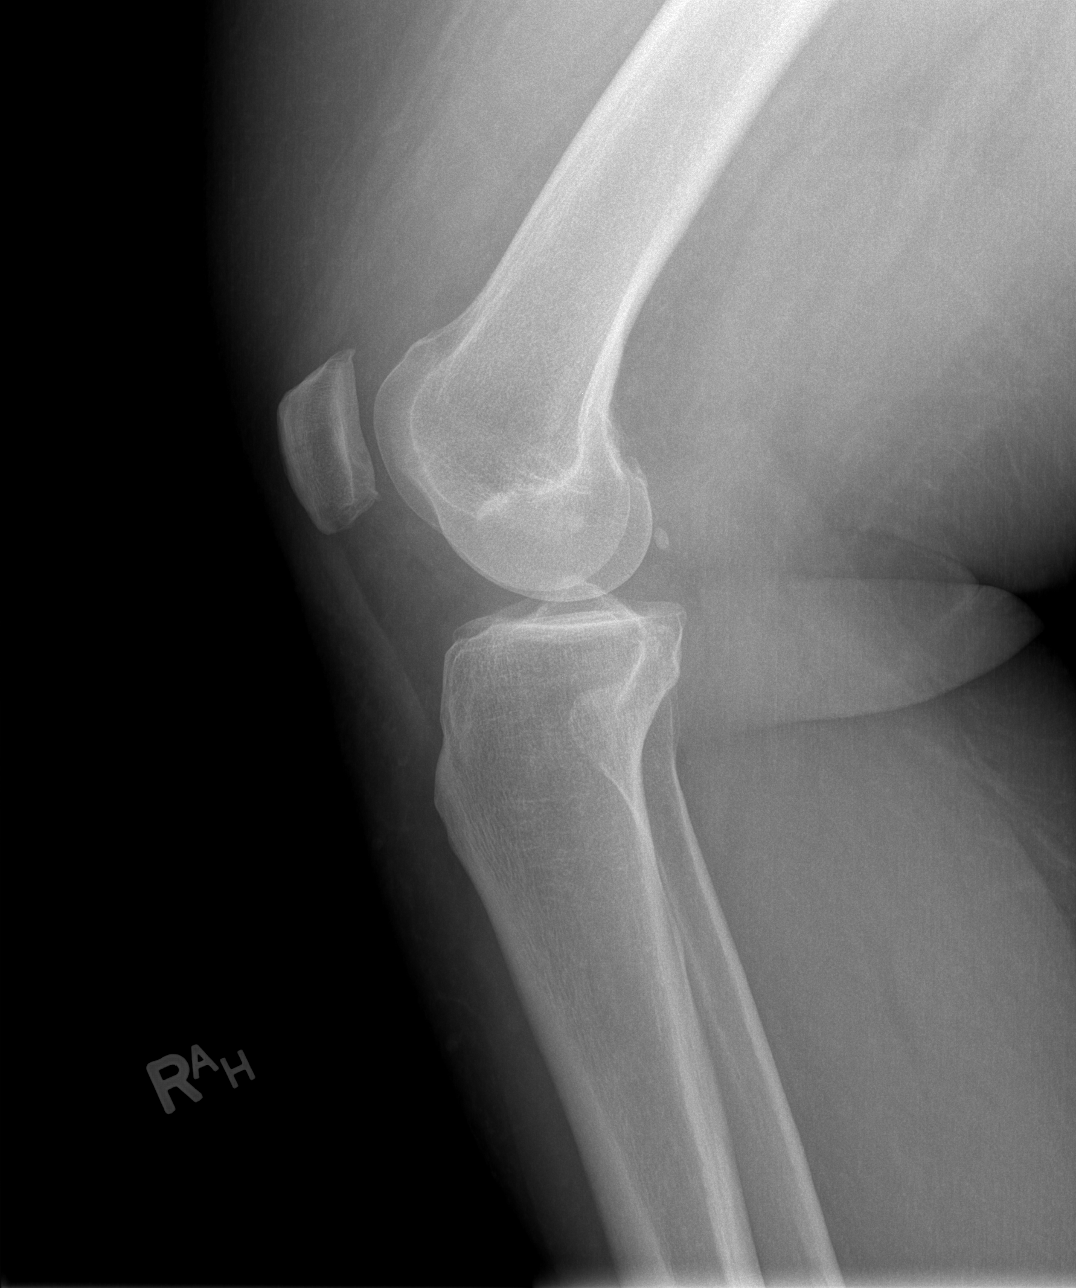

[4 of 4 positions shown; findings below may reference images not displayed]

FINDINGS: The alignment of the knee is anatomic. There is no acute fracture
are identified. There is an old avulsion of the tip of a fibular
head. This appears well corticated. Knee effusion is difficult to
evaluate because of body habitus. Patellar tendon shadow appears
within normal limits. Mild patellofemoral osteoarthritis is present
with marginal osteophytes.
IMPRESSION: No acute osseous abnormality.

## 2016-02-05 ENCOUNTER — Encounter: Payer: Self-pay | Admitting: Allergy

## 2016-02-05 ENCOUNTER — Ambulatory Visit (INDEPENDENT_AMBULATORY_CARE_PROVIDER_SITE_OTHER): Payer: Managed Care, Other (non HMO) | Admitting: Allergy

## 2016-02-05 VITALS — BP 128/70 | HR 76 | Temp 97.8°F | Resp 16

## 2016-02-05 DIAGNOSIS — Z91018 Allergy to other foods: Secondary | ICD-10-CM | POA: Diagnosis not present

## 2016-02-05 DIAGNOSIS — J309 Allergic rhinitis, unspecified: Secondary | ICD-10-CM | POA: Diagnosis not present

## 2016-02-05 DIAGNOSIS — H1010 Acute atopic conjunctivitis, unspecified eye: Secondary | ICD-10-CM | POA: Diagnosis not present

## 2016-02-05 DIAGNOSIS — J4520 Mild intermittent asthma, uncomplicated: Secondary | ICD-10-CM

## 2016-02-05 HISTORY — DX: Mild intermittent asthma, uncomplicated: J45.20

## 2016-02-05 MED ORDER — EPINEPHRINE 0.3 MG/0.3ML IJ SOAJ: 0.3000 mg | Freq: Once | INTRAMUSCULAR | 2 refills | Status: AC

## 2016-02-05 MED ORDER — ALBUTEROL SULFATE HFA 108 (90 BASE) MCG/ACT IN AERS: INHALATION_SPRAY | RESPIRATORY_TRACT | 1 refills | Status: DC

## 2016-02-05 MED ORDER — OLOPATADINE HCL 0.2 % OP SOLN: 1.0000 [drp] | OPHTHALMIC | 5 refills | Status: DC

## 2016-02-05 MED ORDER — MONTELUKAST SODIUM 10 MG PO TABS: 10.0000 mg | ORAL_TABLET | Freq: Every day | ORAL | 5 refills | Status: DC

## 2016-02-05 NOTE — Patient Instructions (Signed)
  Continue the following:  Zyrtec 10mg  in morning Singulair 10mg  in the evening Nasonex 1-2 sprays each nostril as needed for     nasal congestion or drainage Pataday 1 drop each eye as needed for            itchy/watery/red eyes Albuterol inhaler (proair) 2 puffs every 4 hours  as needed for cough, wheeze, shortness  of breath Epipen/Benadryl as needed for allergic reaction  Saline nasal wash each evening and as needed.  Call with new/recurring concerns or ProAir use.  Follow-up in 6 months or sooner if needed.

## 2016-02-05 NOTE — Progress Notes (Signed)
Follow-up Note  RE: Monique Fisher MRN: [id number] DOB: 1970-03-27 Date of Office Visit: 02/05/2016   History of present illness: Monique Fisher is a 45 y.o. female presenting today for follow-up of allergic rhinoconjunctivitis and asthma.  She was last seen in our office on May 19 by Dr. Willa RoughHicks.  Since her last visit she reports this fall she had laryngitis for about 10 days.  On July 14th someone at work heated up fish (salmon patties) and she felt like her throat felt scratchy so she took benadryl.  She has an epipen but did not need to use.  She went to a walk-in clinic after work and she received a solu-medrol shot.  The next day her symptoms were resolved. She continues to avoid fish and shellfish.  For her allergy symptoms she takes zyrtec in AM and Singulair in pm.  She has nasonex that she uses as needed as well as Pataday eye drops as needed.   With her asthma she uses Proair as needed but has not needed to use recently.  Last use was before last visit in the spring.  She denies any nighttime awakenings or oral steroid use, ED or urgent care visits or hospitalizations.       Review of systems: Review of Systems  Constitutional: Negative for chills, fever and weight loss.  HENT: Negative for congestion, sinus pain and sore throat.   Eyes: Negative for discharge and redness.  Respiratory: Negative for cough, shortness of breath and wheezing.   Cardiovascular: Negative for chest pain.  Gastrointestinal: Negative for heartburn, nausea and vomiting.  Skin: Negative for itching and rash.    All other systems negative unless noted above in HPI  Past medical/social/surgical/family history have been reviewed and are unchanged unless specifically indicated below.  No changes  Medication List:   Medication List       Accurate as of 02/05/16  4:19 PM. Always use your most recent med list.          acetaminophen 500 MG tablet Commonly known as:  TYLENOL Take 1,000 mg by mouth 2  (two) times daily.   aspirin 81 MG tablet Take 81 mg by mouth at bedtime.   benzoyl peroxide 10 % Liqd Generic drug:  Benzoyl Peroxide Apply topically.   butalbital-acetaminophen-caffeine 50-500-40 MG tablet Commonly known as:  ESGIC PLUS Take 1 tablet by mouth every 4 (four) hours as needed (MIGRAINES).   cetirizine 10 MG tablet Commonly known as:  ZYRTEC Take 10 mg by mouth daily.   cholecalciferol 1000 units tablet Commonly known as:  VITAMIN D Take 3,000 Units by mouth daily.   CULTURELLE DIGESTIVE HEALTH PO Take 1 capsule by mouth daily. Reported on 07/28/2015   diphenhydrAMINE 12.5 MG chewable tablet Commonly known as:  BENADRYL Chew 25 mg by mouth 4 (four) times daily as needed for allergies.   fenofibrate 54 MG tablet Take 160 mg by mouth at bedtime. Reported on 07/28/2015   fenofibrate 160 MG tablet Take 160 mg by mouth daily.   gabapentin 300 MG capsule Commonly known as:  NEURONTIN Take 600 mg by mouth 3 (three) times daily.   glucosamine-chondroitin 500-400 MG tablet Take 1 tablet by mouth every morning. Reported on 07/28/2015   HYDROcodone-acetaminophen 5-325 MG tablet Commonly known as:  NORCO/VICODIN 1-2 tablets po q 6 hours prn moderate to severe pain   losartan 100 MG tablet Commonly known as:  COZAAR Take 100 mg by mouth daily.   meloxicam 15 MG tablet Commonly  known as:  MOBIC Take 15 mg by mouth daily. Reported on 07/28/2015   metoprolol succinate 50 MG 24 hr tablet Commonly known as:  TOPROL-XL Take 50 mg by mouth daily. Take with or immediately following a meal.   MICROGESTIN 1-20 MG-MCG tablet Generic drug:  norethindrone-ethinyl estradiol Take 1 tablet by mouth daily.   mometasone 50 MCG/ACT nasal spray Commonly known as:  NASONEX Place 1 spray into the nose as needed.   multivitamin with minerals tablet Take 1 tablet by mouth daily.   norethindrone 0.35 MG tablet Commonly known as:  MICRONOR,CAMILA,ERRIN Take 1 tablet by mouth  daily.   omeprazole 20 MG capsule Commonly known as:  PRILOSEC Take 20 mg by mouth daily.   promethazine 25 MG tablet Commonly known as:  PHENERGAN Take 1 tablet (25 mg total) by mouth every 6 (six) hours as needed for nausea.   pyridOXINE 100 MG tablet Commonly known as:  VITAMIN B-6 Take 100 mg by mouth daily.   sodium chloride 0.65 % Soln nasal spray Commonly known as:  OCEAN Place 1 spray into both nostrils as needed.   TART CHERRY ADVANCED PO Take 1,200 mg by mouth.   vitamin B-12 1000 MCG tablet Commonly known as:  CYANOCOBALAMIN Take 1,000 mcg by mouth 2 (two) times a week.       Known medication allergies: Allergies  Allergen Reactions  . Amlodipine     Other reaction(s): NO REACTION  . Amoxicillin   . Erythromycin     "SKIN CRAWLING"  . Fluticasone   . Iohexol      Code: HIVES, Desc: pt states hive w/ct contrast, does fine w/13hr prep, Onset Date: [id number]  Code: HIVES, Desc: pt states hive w/ct contrast, does fine w/13hr prep, Onset Date: [id number]   . Latex   . Lisinopril     NIGHT SWEATS, COUGH   . Penicillins   . Phentermine Hypertension  . Shellfish-Derived Products   . Sulfa Antibiotics      Physical examination: Blood pressure 128/70, pulse 76, temperature 97.8 F (36.6 C), temperature source Oral, resp. rate 16.  General: Alert, interactive, in no acute distress. obese HEENT: TMs pearly gray, turbinates non-edematous without discharge, post-pharynx non erythematous. Neck: Supple without lymphadenopathy. Lungs: Clear to auscultation without wheezing, rhonchi or rales. {no increased work of breathing. CV: Normal S1, S2 without murmurs. Abdomen: Nondistended, nontender. Skin: Warm and dry, without lesions or rashes. Extremities:  No clubbing, cyanosis or edema. Neuro:   Grossly intact.  Diagnositics/Labs:  Spirometry: FEV1: 2.31L   85%, FVC: 2.76L  85%, ratio consistent with nonobstructive pattern  Assessment and plan:   Allergic  rhinoconjunctivitis   - Zyrtec 10mg  in morning    Singulair 10mg  in the evening     Nasonex 1-2 sprays each nostril as needed for nasal congestion or drainage     Pataday 1 drop each eye as needed for itchy/watery/red eyes    Saline nasal wash each evening and as needed.   Mild intermittent asthma  - Albuterol inhaler (proair) 2 puffs every 4 hours as needed for cough, wheeze, shortness  of breath  - At this time well controlled on albuterol as needed and singulair at this time well controlled on Singulair and albuterol on Asthma control goals:   Full participation in all desired activities (may need albuterol before activity)  Albuterol use two time or less a week on average (not counting use with activity)  Cough interfering with sleep two time or less a month  Oral steroids no more than once a year  No hospitalizations   food allergy   - Continue avoidance of shellfish and fish   - Epipen/Benadryl as needed for allergic reaction  Follow-up in 6 months or sooner if needed.   I appreciate the opportunity to take part in Aralynn's care. Please do not hesitate to contact me with questions.  Sincerely,   Margo Aye, MD Allergy/Immunology [facility]

## 2016-02-08 ENCOUNTER — Ambulatory Visit: Payer: Managed Care, Other (non HMO) | Admitting: Allergy

## 2016-02-15 ENCOUNTER — Encounter: Payer: Self-pay | Admitting: *Deleted

## 2016-04-22 ENCOUNTER — Telehealth: Payer: Self-pay | Admitting: Allergy

## 2016-04-22 NOTE — Telephone Encounter (Signed)
Patient has a break out and she doesn't know if it is an allergic reaction or not. She would like to talk to a nurse. Last saw Dr. Delorse LekPadgett on 02-05-16.

## 2016-04-22 NOTE — Telephone Encounter (Signed)
Called patient and she stated that she started breaking out (all over the body) Friday, she hasn't changed anything. Patient stated that some are hives, some have puss and some have busted. She had tried benadryl, and it has not helped. I asked if she wanted to be seen, patient stated she was going [facility].

## 2016-07-18 ENCOUNTER — Other Ambulatory Visit: Payer: Self-pay | Admitting: Obstetrics & Gynecology

## 2016-07-18 DIAGNOSIS — Z1231 Encounter for screening mammogram for malignant neoplasm of breast: Secondary | ICD-10-CM

## 2016-08-12 ENCOUNTER — Encounter: Payer: Self-pay | Admitting: Obstetrics & Gynecology

## 2016-08-12 ENCOUNTER — Ambulatory Visit (INDEPENDENT_AMBULATORY_CARE_PROVIDER_SITE_OTHER): Payer: Managed Care, Other (non HMO) | Admitting: Obstetrics & Gynecology

## 2016-08-12 ENCOUNTER — Ambulatory Visit
Admission: RE | Admit: 2016-08-12 | Discharge: 2016-08-12 | Disposition: A | Discharge status: Home / Self Care | Payer: Managed Care, Other (non HMO) | Source: Ambulatory Visit | Attending: Obstetrics & Gynecology | Admitting: Obstetrics & Gynecology

## 2016-08-12 VITALS — BP 130/78 | Ht 62.0 in | Wt 323.0 lb

## 2016-08-12 DIAGNOSIS — Z01419 Encounter for gynecological examination (general) (routine) without abnormal findings: Secondary | ICD-10-CM | POA: Diagnosis not present

## 2016-08-12 DIAGNOSIS — Z3041 Encounter for surveillance of contraceptive pills: Secondary | ICD-10-CM | POA: Diagnosis not present

## 2016-08-12 DIAGNOSIS — Z1231 Encounter for screening mammogram for malignant neoplasm of breast: Secondary | ICD-10-CM

## 2016-08-12 MED ORDER — NORETHINDRONE ACET-ETHINYL EST 1-20 MG-MCG PO TABS: 1.0000 | ORAL_TABLET | Freq: Every day | ORAL | 4 refills | Status: DC

## 2016-08-12 NOTE — Progress Notes (Signed)
    Monique CalixLisa Fisher 28-Sep-1970 [id number]   History:    46 y.o.G0 Boyfriend x 8 mths  HPI:  Well on Microgestin 1/20.  No pelvic pain.  No abnormal bleeding.  Normal secretions.  No using condoms with boyfriend anymore.  Breasts wnl.    RP:  Established patient presenting  for annual gyn exam   HPI:  Well on Microgestin 1/20.  Past medical history,surgical history, family history and social history were all reviewed and documented in the EPIC chart.  Gynecologic History No LMP recorded (lmp unknown). Patient is not currently having periods (Reason: Oral contraceptives). Contraception: OCP (estrogen/progesterone) Last Pap: 01/2016. Results were: normal Last mammogram: 08/2016. Results were: Negative  Obstetric History OB History  Gravida Para Term Preterm AB Living  0 0 0 0 0 0  SAB TAB Ectopic Multiple Live Births  0 0 0 0 0         ROS: A ROS was performed and pertinent positives and negatives are included in the history.  GENERAL: No fevers or chills. HEENT: No change in vision, no earache, sore throat or sinus congestion. NECK: No pain or stiffness. CARDIOVASCULAR: No chest pain or pressure. No palpitations. PULMONARY: No shortness of breath, cough or wheeze. GASTROINTESTINAL: No abdominal pain, nausea, vomiting or diarrhea, melena or bright red blood per rectum. GENITOURINARY: No urinary frequency, urgency, hesitancy or dysuria. MUSCULOSKELETAL: No joint or muscle pain, no back pain, no recent trauma. DERMATOLOGIC: No rash, no itching, no lesions. ENDOCRINE: No polyuria, polydipsia, no heat or cold intolerance. No recent change in weight. HEMATOLOGICAL: No anemia or easy bruising or bleeding. NEUROLOGIC: No headache, seizures, numbness, tingling or weakness. PSYCHIATRIC: No depression, no loss of interest in normal activity or change in sleep pattern.     Exam:   BP 130/78   Ht 5\' 2"  (1.575 m)   Wt (!) 323 lb (146.5 kg)   LMP  (LMP Unknown) Comment: pt takes BC continuesly no  cycle  BMI 59.08 kg/m   Body mass index is 59.08 kg/m.  General appearance : Well developed well nourished female. No acute distress HEENT: Eyes: no retinal hemorrhage or exudates,  Neck supple, trachea midline, no carotid bruits, no thyroidmegaly Lungs: Clear to auscultation, no rhonchi or wheezes, or rib retractions  Heart: Regular rate and rhythm, no murmurs or gallops Breast:Examined in sitting and supine position were symmetrical in appearance, no palpable masses or tenderness,  no skin retraction, no nipple inversion, no nipple discharge, no skin discoloration, no axillary or supraclavicular lymphadenopathy Abdomen: no palpable masses or tenderness, no rebound or guarding Extremities: no edema or skin discoloration or tenderness  Pelvic:  Bartholin, Urethra, Skene Glands: Within normal limits             Vagina: No gross lesions or discharge  Cervix: No gross lesions or discharge.  Pap/HPV HR done.  Uterus  AV, normal size, shape and consistency, non-tender and mobile  Adnexa  Without masses or tenderness  Anus and perineum  normal   Assessment/Plan:  46 y.o. female for annual exam   1. Encounter for routine gynecological examination with Papanicolaou smear of cervix Normal Gyn exam.  Pap/HPV done.  Breasts wnl.  Mammo 08/2016  2. Encounter for surveillance of contraceptive pills Microgestin 1/20 represcribed.  3. Morbid obesity (HCC) BMI 59.08.  Low carb diet and physical activity recommended.    Monique DelMarie-Lyne Cobi Delph MD, 2:40 PM 08/12/2016

## 2016-08-13 LAB — PAP, TP IMAGING W/ HPV RNA, RFLX HPV TYPE 16,18/45: HPV MRNA, HIGH RISK: NOT DETECTED

## 2016-08-14 ENCOUNTER — Other Ambulatory Visit: Payer: Self-pay | Admitting: Allergy

## 2016-08-14 DIAGNOSIS — H1010 Acute atopic conjunctivitis, unspecified eye: Secondary | ICD-10-CM

## 2016-08-14 DIAGNOSIS — J309 Allergic rhinitis, unspecified: Principal | ICD-10-CM

## 2016-08-15 NOTE — Patient Instructions (Signed)
1. Encounter for routine gynecological examination with Papanicolaou smear of cervix Normal Gyn exam.  Pap/HPV done.  Breasts wnl.  Mammo 08/2016  2. Encounter for surveillance of contraceptive pills Microgestin 1/20 represcribed.  3. Morbid obesity (HCC) BMI 59.08.  Low carb diet and physical activity recommended.    Monique Fisher, it was a pleasure to see you today!  I will inform you of your result as soon as available!  Below, you will find recommendations for a healthy life style.  Health Maintenance, Female Adopting a healthy lifestyle and getting preventive care can go a long way to promote health and wellness. Talk with your health care provider about what schedule of regular examinations is right for you. This is a good chance for you to check in with your provider about disease prevention and staying healthy. In between checkups, there are plenty of things you can do on your own. Experts have done a lot of research about which lifestyle changes and preventive measures are most likely to keep you healthy. Ask your health care provider for more information. Weight and diet Eat a healthy diet  Be sure to include plenty of vegetables, fruits, low-fat dairy products, and lean protein.  Do not eat a lot of foods high in solid fats, added sugars, or salt.  Get regular exercise. This is one of the most important things you can do for your health. ? Most adults should exercise for at least 150 minutes each week. The exercise should increase your heart rate and make you sweat (moderate-intensity exercise). ? Most adults should also do strengthening exercises at least twice a week. This is in addition to the moderate-intensity exercise.  Maintain a healthy weight  Body mass index (BMI) is a measurement that can be used to identify possible weight problems. It estimates body fat based on height and weight. Your health care provider can help determine your BMI and help you achieve or maintain a healthy  weight.  For females 50 years of age and older: ? A BMI below 18.5 is considered underweight. ? A BMI of 18.5 to 24.9 is normal. ? A BMI of 25 to 29.9 is considered overweight. ? A BMI of 30 and above is considered obese.  Watch levels of cholesterol and blood lipids  You should start having your blood tested for lipids and cholesterol at 47 years of age, then have this test every 5 years.  You may need to have your cholesterol levels checked more often if: ? Your lipid or cholesterol levels are high. ? You are older than 47 years of age. ? You are at high risk for heart disease.  Cancer screening Lung Cancer  Lung cancer screening is recommended for adults 83-96 years old who are at high risk for lung cancer because of a history of smoking.  A yearly low-dose CT scan of the lungs is recommended for people who: ? Currently smoke. ? Have quit within the past 15 years. ? Have at least a 30-pack-year history of smoking. A pack year is smoking an average of one pack of cigarettes a day for 1 year.  Yearly screening should continue until it has been 15 years since you quit.  Yearly screening should stop if you develop a health problem that would prevent you from having lung cancer treatment.  Breast Cancer  Practice breast self-awareness. This means understanding how your breasts normally appear and feel.  It also means doing regular breast self-exams. Let your health care provider know about  any changes, no matter how small.  If you are in your 20s or 30s, you should have a clinical breast exam (CBE) by a health care provider every 1-3 years as part of a regular health exam.  If you are 64 or older, have a CBE every year. Also consider having a breast X-ray (mammogram) every year.  If you have a family history of breast cancer, talk to your health care provider about genetic screening.  If you are at high risk for breast cancer, talk to your health care provider about having an  MRI and a mammogram every year.  Breast cancer gene (BRCA) assessment is recommended for women who have family members with BRCA-related cancers. BRCA-related cancers include: ? Breast. ? Ovarian. ? Tubal. ? Peritoneal cancers.  Results of the assessment will determine the need for genetic counseling and BRCA1 and BRCA2 testing.  Cervical Cancer Your health care provider may recommend that you be screened regularly for cancer of the pelvic organs (ovaries, uterus, and vagina). This screening involves a pelvic examination, including checking for microscopic changes to the surface of your cervix (Pap test). You may be encouraged to have this screening done every 3 years, beginning at age 54.  For women ages 55-65, health care providers may recommend pelvic exams and Pap testing every 3 years, or they may recommend the Pap and pelvic exam, combined with testing for human papilloma virus (HPV), every 5 years. Some types of HPV increase your risk of cervical cancer. Testing for HPV may also be done on women of any age with unclear Pap test results.  Other health care providers may not recommend any screening for nonpregnant women who are considered low risk for pelvic cancer and who do not have symptoms. Ask your health care provider if a screening pelvic exam is right for you.  If you have had past treatment for cervical cancer or a condition that could lead to cancer, you need Pap tests and screening for cancer for at least 20 years after your treatment. If Pap tests have been discontinued, your risk factors (such as having a new sexual partner) need to be reassessed to determine if screening should resume. Some women have medical problems that increase the chance of getting cervical cancer. In these cases, your health care provider may recommend more frequent screening and Pap tests.  Colorectal Cancer  This type of cancer can be detected and often prevented.  Routine colorectal cancer screening  usually begins at 48 years of age and continues through 47 years of age.  Your health care provider may recommend screening at an earlier age if you have risk factors for colon cancer.  Your health care provider may also recommend using home test kits to check for hidden blood in the stool.  A small camera at the end of a tube can be used to examine your colon directly (sigmoidoscopy or colonoscopy). This is done to check for the earliest forms of colorectal cancer.  Routine screening usually begins at age 53.  Direct examination of the colon should be repeated every 5-10 years through 47 years of age. However, you may need to be screened more often if early forms of precancerous polyps or small growths are found.  Skin Cancer  Check your skin from head to toe regularly.  Tell your health care provider about any new moles or changes in moles, especially if there is a change in a mole's shape or color.  Also tell your health care provider  if you have a mole that is larger than the size of a pencil eraser.  Always use sunscreen. Apply sunscreen liberally and repeatedly throughout the day.  Protect yourself by wearing long sleeves, pants, a wide-brimmed hat, and sunglasses whenever you are outside.  Heart disease, diabetes, and high blood pressure  High blood pressure causes heart disease and increases the risk of stroke. High blood pressure is more likely to develop in: ? People who have blood pressure in the high end of the normal range (130-139/85-89 mm Hg). ? People who are overweight or obese. ? People who are African American.  If you are 38-101 years of age, have your blood pressure checked every 3-5 years. If you are 72 years of age or older, have your blood pressure checked every year. You should have your blood pressure measured twice-once when you are at a hospital or clinic, and once when you are not at a hospital or clinic. Record the average of the two measurements. To check  your blood pressure when you are not at a hospital or clinic, you can use: ? An automated blood pressure machine at a pharmacy. ? A home blood pressure monitor.  If you are between 52 years and 64 years old, ask your health care provider if you should take aspirin to prevent strokes.  Have regular diabetes screenings. This involves taking a blood sample to check your fasting blood sugar level. ? If you are at a normal weight and have a low risk for diabetes, have this test once every three years after 47 years of age. ? If you are overweight and have a high risk for diabetes, consider being tested at a younger age or more often. Preventing infection Hepatitis B  If you have a higher risk for hepatitis B, you should be screened for this virus. You are considered at high risk for hepatitis B if: ? You were born in a country where hepatitis B is common. Ask your health care provider which countries are considered high risk. ? Your parents were born in a high-risk country, and you have not been immunized against hepatitis B (hepatitis B vaccine). ? You have HIV or AIDS. ? You use needles to inject street drugs. ? You live with someone who has hepatitis B. ? You have had sex with someone who has hepatitis B. ? You get hemodialysis treatment. ? You take certain medicines for conditions, including cancer, organ transplantation, and autoimmune conditions.  Hepatitis C  Blood testing is recommended for: ? Everyone born from [birth year] through 1966. ? Anyone with known risk factors for hepatitis C.  Sexually transmitted infections (STIs)  You should be screened for sexually transmitted infections (STIs) including gonorrhea and chlamydia if: ? You are sexually active and are younger than 47 years of age. ? You are older than 47 years of age and your health care provider tells you that you are at risk for this type of infection. ? Your sexual activity has changed since you were last screened and you  are at an increased risk for chlamydia or gonorrhea. Ask your health care provider if you are at risk.  If you do not have HIV, but are at risk, it may be recommended that you take a prescription medicine daily to prevent HIV infection. This is called pre-exposure prophylaxis (PrEP). You are considered at risk if: ? You are sexually active and do not regularly use condoms or know the HIV status of your partner(s). ? You take drugs  by injection. ? You are sexually active with a partner who has HIV.  Talk with your health care provider about whether you are at high risk of being infected with HIV. If you choose to begin PrEP, you should first be tested for HIV. You should then be tested every 3 months for as long as you are taking PrEP. Pregnancy  If you are premenopausal and you may become pregnant, ask your health care provider about preconception counseling.  If you may become pregnant, take 400 to 800 micrograms (mcg) of folic acid every day.  If you want to prevent pregnancy, talk to your health care provider about birth control (contraception). Osteoporosis and menopause  Osteoporosis is a disease in which the bones lose minerals and strength with aging. This can result in serious bone fractures. Your risk for osteoporosis can be identified using a bone density scan.  If you are 29 years of age or older, or if you are at risk for osteoporosis and fractures, ask your health care provider if you should be screened.  Ask your health care provider whether you should take a calcium or vitamin D supplement to lower your risk for osteoporosis.  Menopause may have certain physical symptoms and risks.  Hormone replacement therapy may reduce some of these symptoms and risks. Talk to your health care provider about whether hormone replacement therapy is right for you. Follow these instructions at home:  Schedule regular health, dental, and eye exams.  Stay current with your  immunizations.  Do not use any tobacco products including cigarettes, chewing tobacco, or electronic cigarettes.  If you are pregnant, do not drink alcohol.  If you are breastfeeding, limit how much and how often you drink alcohol.  Limit alcohol intake to no more than 1 drink per day for nonpregnant women. One drink equals 12 ounces of beer, 5 ounces of wine, or 1 ounces of hard liquor.  Do not use street drugs.  Do not share needles.  Ask your health care provider for help if you need support or information about quitting drugs.  Tell your health care provider if you often feel depressed.  Tell your health care provider if you have ever been abused or do not feel safe at home. This information is not intended to replace advice given to you by your health care provider. Make sure you discuss any questions you have with your health care provider. Document Released: 09/10/2010 Document Revised: 08/03/2015 Document Reviewed: 11/29/2014 Elsevier Interactive Patient Education  [employer].

## 2016-08-19 ENCOUNTER — Ambulatory Visit: Payer: Managed Care, Other (non HMO) | Admitting: Allergy

## 2016-09-02 ENCOUNTER — Ambulatory Visit: Payer: Managed Care, Other (non HMO) | Admitting: Allergy

## 2016-09-23 ENCOUNTER — Encounter: Payer: Self-pay | Admitting: Allergy

## 2016-09-23 ENCOUNTER — Ambulatory Visit (INDEPENDENT_AMBULATORY_CARE_PROVIDER_SITE_OTHER): Payer: Managed Care, Other (non HMO) | Admitting: Allergy

## 2016-09-23 VITALS — BP 116/70 | HR 86 | Temp 97.8°F | Resp 14 | Ht 62.5 in | Wt 323.0 lb

## 2016-09-23 DIAGNOSIS — H1010 Acute atopic conjunctivitis, unspecified eye: Secondary | ICD-10-CM | POA: Diagnosis not present

## 2016-09-23 DIAGNOSIS — L739 Follicular disorder, unspecified: Secondary | ICD-10-CM | POA: Diagnosis not present

## 2016-09-23 DIAGNOSIS — J309 Allergic rhinitis, unspecified: Secondary | ICD-10-CM | POA: Diagnosis not present

## 2016-09-23 DIAGNOSIS — Z91018 Allergy to other foods: Secondary | ICD-10-CM

## 2016-09-23 DIAGNOSIS — J4520 Mild intermittent asthma, uncomplicated: Secondary | ICD-10-CM

## 2016-09-23 MED ORDER — MONTELUKAST SODIUM 10 MG PO TABS: 10.0000 mg | ORAL_TABLET | Freq: Every day | ORAL | 5 refills | Status: DC

## 2016-09-23 MED ORDER — EPINEPHRINE 0.3 MG/0.3ML IJ SOAJ: 0.3000 mg | Freq: Once | INTRAMUSCULAR | 0 refills | Status: AC

## 2016-09-23 NOTE — Patient Instructions (Signed)
Allergic rhinoconjunctivitis   - Continue Zyrtec 10mg  in morning   - Continue Singulair 10mg  in the evening    -Continue Nasonex 1-2 sprays each nostril as needed for nasal congestion or drainage    -Continue Pataday 1 drop each eye as needed for itchy/watery/red eyes    -Saline nasal wash each evening and as needed.   Mild intermittent asthma  - Albuterol inhaler (proair) 2 puffs every 4 hours as needed for cough, wheeze, shortness       of breath  - At this time well controlled on albuterol as needed and singulair at this time well controlled on Singulair and albuterol on Asthma control goals:   Full participation in all desired activities (may need albuterol before activity)  Albuterol use two time or less a week on average (not counting use with activity)  Cough interfering with sleep two time or less a month  Oral steroids no more than once a year  No hospitalizations   Food allergy   - Continue avoidance of shellfish and fish   - Epipen/Benadryl as needed for allergic reaction  Rash -Most likely folliculitis versus urticaria  -If rash occurs again take photos to document appearance -Increase Zyrtec to twice daily if rash occurs -Increase Pepcid twice daily if rash occurs  Follow-up in 6-9 months or sooner if needed.
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
## 2017-03-14 ENCOUNTER — Other Ambulatory Visit: Payer: Self-pay | Admitting: Allergy

## 2017-03-14 DIAGNOSIS — J309 Allergic rhinitis, unspecified: Principal | ICD-10-CM

## 2017-03-14 DIAGNOSIS — H1010 Acute atopic conjunctivitis, unspecified eye: Secondary | ICD-10-CM

## 2017-04-11 ENCOUNTER — Other Ambulatory Visit: Payer: Self-pay | Admitting: Obstetrics & Gynecology

## 2017-04-14 ENCOUNTER — Encounter: Payer: Self-pay | Admitting: Allergy

## 2017-04-14 ENCOUNTER — Ambulatory Visit: Payer: Managed Care, Other (non HMO) | Admitting: Allergy

## 2017-04-14 VITALS — BP 130/78 | HR 96 | Resp 19 | Ht 63.0 in | Wt 339.0 lb

## 2017-04-14 DIAGNOSIS — J309 Allergic rhinitis, unspecified: Secondary | ICD-10-CM | POA: Diagnosis not present

## 2017-04-14 DIAGNOSIS — J4520 Mild intermittent asthma, uncomplicated: Secondary | ICD-10-CM

## 2017-04-14 DIAGNOSIS — Z91018 Allergy to other foods: Secondary | ICD-10-CM

## 2017-04-14 DIAGNOSIS — H1010 Acute atopic conjunctivitis, unspecified eye: Secondary | ICD-10-CM | POA: Diagnosis not present

## 2017-04-14 MED ORDER — LEVALBUTEROL TARTRATE 45 MCG/ACT IN AERO: 2.0000 | INHALATION_SPRAY | Freq: Four times a day (QID) | RESPIRATORY_TRACT | 5 refills | Status: AC | PRN

## 2017-04-14 MED ORDER — OLOPATADINE HCL 0.2 % OP SOLN: 1.0000 [drp] | OPHTHALMIC | 5 refills | Status: DC

## 2017-04-14 MED ORDER — LEVOCETIRIZINE DIHYDROCHLORIDE 5 MG PO TABS: 5.0000 mg | ORAL_TABLET | Freq: Every evening | ORAL | 5 refills | Status: DC

## 2017-04-14 MED ORDER — EPINEPHRINE 0.3 MG/0.3ML IJ SOAJ: 0.3000 mg | Freq: Once | INTRAMUSCULAR | 1 refills | Status: AC

## 2017-04-14 MED ORDER — MONTELUKAST SODIUM 10 MG PO TABS: 10.0000 mg | ORAL_TABLET | Freq: Every day | ORAL | 5 refills | Status: DC

## 2017-04-14 NOTE — Progress Notes (Signed)
Follow-up Note  RE: Monique Fisher MRN: [id number] DOB: [birth year] Date of Office Visit: 04/14/2017   History of present illness: Monique Fisher is a 47 y.o. female presenting today for follow-up of allergic rhinoconjunctivitis, asthma and food allergy. She was last seen in the office on 09/23/16 by myself.  She denies any major health changes, surgeries or hospitalizations.  She states she does not like to use nasal sprays and asks if she needs to use nasonex or if there is another other options for control of nasal congestion.  She does states on some days she develops nasal drainage around midday.  She feels the zyrtec may not be lasting long enough in the day to control her symptoms. She does take zyrtec in AM and singulair in PM.  She also uses pataday as needed for ocular symptoms.     She has asthma but feels her symptoms are well-controlled.  She does not like use of albuterol as she reports she gets jittery/"wired" with use.  She states she does wheeze occasionally but tries to see it it will subside on his own vs taking albuterol and feeling symptomatic.      She continues to avoid shellfish and fish without any accidental ingestions.  She has UTD epipen.      She has not had any further episodes or issues with rash since her last visit.    Review of systems: Review of Systems  Constitutional: Negative for chills, fever, malaise/fatigue and weight loss.  HENT: Positive for congestion. Negative for nosebleeds, sinus pain and sore throat.   Eyes: Negative for pain, discharge and redness.  Respiratory: Positive for wheezing. Negative for cough, sputum production and shortness of breath.   Cardiovascular: Negative for chest pain.  Gastrointestinal: Negative for abdominal pain, constipation, diarrhea, heartburn, nausea and vomiting.  Musculoskeletal: Negative for joint pain.  Skin: Negative for itching and rash.  Neurological: Negative for headaches.    All other systems negative unless  noted above in HPI  Past medical/social/surgical/family history have been reviewed and are unchanged unless specifically indicated below.  No changes  Medication List: Allergies as of 04/14/2017      Reactions   Amlodipine    Other reaction(s): NO REACTION   Amoxicillin    Erythromycin    "SKIN CRAWLING"   Fluticasone    Iodinated Diagnostic Agents    Iohexol     Code: HIVES, Desc: pt states hive w/ct contrast, does fine w/13hr prep, Onset Date: [id number]  Code: HIVES, Desc: pt states hive w/ct contrast, does fine w/13hr prep, Onset Date: [id number]   Latex    Lisinopril    NIGHT SWEATS, COUGH   Penicillins    Phentermine Hypertension   Shellfish-derived Products    Sulfa Antibiotics       Medication List        Accurate as of 04/14/17  4:10 PM. Always use your most recent med list.          acetaminophen 500 MG tablet Commonly known as:  TYLENOL Take 1,000 mg by mouth 2 (two) times daily.   aspirin 81 MG tablet Take 81 mg by mouth at bedtime.   benzoyl peroxide 10 % Liqd Generic drug:  Benzoyl Peroxide Apply topically.   cholecalciferol 1000 units tablet Commonly known as:  VITAMIN D Take 3,000 Units by mouth daily.   diphenhydrAMINE 12.5 MG chewable tablet Commonly known as:  BENADRYL Chew 25 mg by mouth 4 (four) times daily as needed for allergies.  EPINEPHrine 0.3 mg/0.3 mL Soaj injection Commonly known as:  EPI-PEN Inject 0.3 mLs (0.3 mg total) into the muscle once for 1 dose.   fenofibrate 160 MG tablet Take 160 mg by mouth daily.   gabapentin 300 MG capsule Commonly known as:  NEURONTIN Take 600 mg by mouth 3 (three) times daily.   glucosamine-chondroitin 500-400 MG tablet Take 1 tablet by mouth every morning. Reported on 07/28/2015   ibuprofen 800 MG tablet Commonly known as:  ADVIL,MOTRIN   JARDIANCE 25 MG Tabs tablet Generic drug:  empagliflozin   levalbuterol 45 MCG/ACT inhaler Commonly known as:  XOPENEX HFA Inhale 2 puffs into the  lungs every 6 (six) hours as needed for wheezing.   levocetirizine 5 MG tablet Commonly known as:  XYZAL Take 1 tablet (5 mg total) by mouth every evening.   losartan 100 MG tablet Commonly known as:  COZAAR Take 100 mg by mouth daily.   metoprolol succinate 50 MG 24 hr tablet Commonly known as:  TOPROL-XL Take 50 mg by mouth daily. Take with or immediately following a meal.   MICROGESTIN 1-20 MG-MCG tablet Generic drug:  norethindrone-ethinyl estradiol TAKE 1 TABLET BY MOUTH ONCE DAILY   montelukast 10 MG tablet Commonly known as:  SINGULAIR Take 1 tablet (10 mg total) by mouth at bedtime.   multivitamin with minerals tablet Take 1 tablet by mouth daily.   Olopatadine HCl 0.2 % Soln Commonly known as:  PATADAY Place 1 drop into both eyes 1 day or 1 dose.   omeprazole 20 MG capsule Commonly known as:  PRILOSEC Take 20 mg by mouth daily.   ondansetron 4 MG disintegrating tablet Commonly known as:  ZOFRAN-ODT   pyridOXINE 100 MG tablet Commonly known as:  VITAMIN B-6 Take 100 mg by mouth daily.   TART CHERRY ADVANCED PO Take 1,200 mg by mouth.   vitamin B-12 1000 MCG tablet Commonly known as:  CYANOCOBALAMIN Take 1,000 mcg by mouth 2 (two) times a week.   ZOMIG 2.5 MG tablet Generic drug:  ZOLMitriptan Take 2.5 mg by mouth once. May repeat in 2 hours if headache persists or recurs.       Known medication allergies: Allergies  Allergen Reactions  . Amlodipine     Other reaction(s): NO REACTION  . Amoxicillin   . Erythromycin     "SKIN CRAWLING"  . Fluticasone   . Iodinated Diagnostic Agents   . Iohexol      Code: HIVES, Desc: pt states hive w/ct contrast, does fine w/13hr prep, Onset Date: [id number]  Code: HIVES, Desc: pt states hive w/ct contrast, does fine w/13hr prep, Onset Date: [id number]   . Latex   . Lisinopril     NIGHT SWEATS, COUGH   . Penicillins   . Phentermine Hypertension  . Shellfish-Derived Products   . Sulfa Antibiotics       Physical examination: Blood pressure 130/78, pulse 96, resp. rate 19, height 5\' 3"  (1.6 m), weight (!) 393 lb 3.2 oz (178.4 kg), SpO2 96 %.  General: Alert, interactive, in no acute distress, obese. HEENT: PERRLA, TMs pearly gray, turbinates minimally edematous with clear discharge, post-pharynx non erythematous. Neck: Supple without lymphadenopathy. Lungs: Clear to auscultation without wheezing, rhonchi or rales. {no increased work of breathing. CV: Normal S1, S2 without murmurs. Abdomen: Nondistended, nontender. Skin: Warm and dry, without lesions or rashes. Extremities:  No clubbing, cyanosis or edema. Neuro:   Grossly intact.  Diagnositics/Labs:  Spirometry: FEV1: 2.43L  87%, FVC: 2.96L  85%, ratio  consistent with nonobstructive pattern  Assessment and plan:   Allergic rhinoconjunctivitis   - stop Zyrtec and trial Xyzal 5mg  daily.   If you are finding that Xyzal doesn't cover your symptoms for 24hr then you may add back in Zyrtec for antihistamine dosing twice a day    - Continue Singulair 10mg  in the evening    - use nasal spray (Nasonex) as needed for nasal congestion or during illnesses like sinus infections    -Continue Pataday 1 drop each eye as needed for itchy/watery/red eyes    -Saline nasal wash each evening and as needed.   Mild intermittent asthma  - Will change albuterol to Xopenex to decrease side effects related to albuterol.  Use Xopenex 2 puffs every 4-6 hours as needed for cough, wheeze, shortness of breath  - At this time well controlled on albuterol as needed and singulair Asthma control goals:   Full participation in all desired activities (may need albuterol before activity)  Albuterol use two time or less a week on average (not counting use with activity)  Cough interfering with sleep two time or less a month  Oral steroids no more than once a year  No hospitalizations   Food allergy   - Continue avoidance of shellfish and fish   -  Epipen/Benadryl as needed for allergic reaction  Rash, resolved -Most likely folliculitis versus urticaria  -If rash occurs again take photos to document appearance -Increase Zyrtec or Xyzal to twice daily if rash occurs -Add Pepcid twice daily if rash occurs  Follow-up in 6-9 months or sooner if needed.  I appreciate the opportunity to take part in Antasia's care. Please do not hesitate to contact me with questions.  Sincerely,   Margo Aye, MD Allergy/Immunology [facility]

## 2017-04-14 NOTE — Patient Instructions (Addendum)
Allergic rhinoconjunctivitis   - stop Zyrtec and trial Xyzal 5mg  daily.   If you are finding that Xyzal doesn't cover your symptoms for 24hr then you may add back in Zyrtec for antihistamine dosing twice a day    - Continue Singulair 10mg  in the evening    - use nasal spray (Nasonex) as needed for nasal congestion or during illnesses like sinus infections    -Continue Pataday 1 drop each eye as needed for itchy/watery/red eyes    -Saline nasal wash each evening and as needed.   Mild intermittent asthma  - Will change albuterol to Xopenex to decrease side effects related to albuterol.  Use Xopenex 2 puffs every 4-6 hours as needed for cough, wheeze, shortness of breath  - At this time well controlled on albuterol as needed and singulair Asthma control goals:   Full participation in all desired activities (may need albuterol before activity)  Albuterol use two time or less a week on average (not counting use with activity)  Cough interfering with sleep two time or less a month  Oral steroids no more than once a year  No hospitalizations   Food allergy   - Continue avoidance of shellfish and fish   - Epipen/Benadryl as needed for allergic reaction  Rash, resolved -Most likely folliculitis versus urticaria  -If rash occurs again take photos to document appearance -Increase Zyrtec or Xyzal to twice daily if rash occurs -Add Pepcid twice daily if rash occurs  Follow-up in 6-9 months or sooner if needed.

## 2017-04-15 ENCOUNTER — Telehealth: Payer: Self-pay | Admitting: Allergy

## 2017-04-15 NOTE — Telephone Encounter (Signed)
Patient was seen 04-14-17, by Dr. Delorse LekPadgett. She said the weight on her AVS scared her and is way off. She would like to talk to someone about this.

## 2017-04-16 NOTE — Telephone Encounter (Signed)
Called and spoke with patient about this concern. I worked her up during this visit and remember her weight and after reviewing her AVS I realized I switched the last two numbers. I have fixed this and informed patient, I apologized to her  for the switch.

## 2017-06-13 ENCOUNTER — Other Ambulatory Visit: Payer: Self-pay

## 2017-06-13 MED ORDER — NORETHINDRONE ACET-ETHINYL EST 1-20 MG-MCG PO TABS: 1.0000 | ORAL_TABLET | Freq: Every day | ORAL | 0 refills | Status: DC

## 2017-07-02 ENCOUNTER — Other Ambulatory Visit: Payer: Self-pay | Admitting: Obstetrics & Gynecology

## 2017-07-02 DIAGNOSIS — Z1231 Encounter for screening mammogram for malignant neoplasm of breast: Secondary | ICD-10-CM

## 2017-07-14 ENCOUNTER — Encounter: Payer: Self-pay | Admitting: Registered"

## 2017-07-14 ENCOUNTER — Encounter: Payer: Managed Care, Other (non HMO) | Attending: Endocrinology | Admitting: Registered"

## 2017-07-14 DIAGNOSIS — E1165 Type 2 diabetes mellitus with hyperglycemia: Secondary | ICD-10-CM

## 2017-07-14 DIAGNOSIS — E119 Type 2 diabetes mellitus without complications: Secondary | ICD-10-CM | POA: Insufficient documentation

## 2017-07-14 DIAGNOSIS — Z713 Dietary counseling and surveillance: Secondary | ICD-10-CM | POA: Diagnosis not present

## 2017-07-14 NOTE — Progress Notes (Signed)
Diabetes Self-Management Education  Visit Type: First/Initial  Appt. Start Time: 1430 Appt. End Time: 1600  07/17/2017  [name], identified by name and date of birth, is a 47 y.o. female with a diagnosis of Diabetes: Type 2.   ASSESSMENT Patient states she was not able to take metformin due to severe cramps and pt states due to slow metabolism Dr Talmage Nap recently switched her from [place] to [place]. Pt states Dr. Talmage Nap suggested gastric surgery, but patient states she does not want to go that route. Pt states she took the pre-diabetes class at [facility], but said it was just focused on reducing fat and it did not help her.  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 Patient reports she has a severe reaction to seafood and even the smell can cause a reaction. Pt states her employer has banned re-heating fish in the employee microwave on her floor due to her allergy. Patient states she has a changing schedules for sleep and eating. Sometimes will eat late, 8-9 pm and goes to bed 10-12 pm.   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 Pt states she would have to miss school at least one day a month and she also became anemic. Pt reports she was started on OCP to control periods and has been on ever since except during 18 months while trying to get pregnant. Pt states she was not able to get pregnant but states her husband (now divorced) had a low sperm count was the issue. Patient does not think she has PCOS because patient states her doctor ran a test and her pancrease is producing appropriate amount of insulin.  Other relavent medications/labs: Bellviq (for wt loss); Tart cherry, b-12, glucosamine-chondroitin, vit D, B-6, "Severe allergies to many things" Epi-pen GERD, cholecystomy 2010; TG 280, non-HDL 145.   Glucometer provided: Contour Next Lot #: [id number] Exp:  08/09/2018 BG: 151 mg/dL  Diabetes Self-Management Education - 07/14/17 1457      Visit Information   Visit Type  First/Initial      Initial Visit   Diabetes Type  Type 2    Are you currently following a meal plan?  No    Are you taking your medications as prescribed?  Yes    Date Diagnosed  about a year ago      Health Coping   How would you rate your overall health?  Fair      Psychosocial Assessment   Patient Belief/Attitude about Diabetes  Other (comment)    How often do you need to have someone help you when you read instructions, pamphlets, or other written materials from your doctor or pharmacy?  1 - Never    What is the last grade level you completed in school?  bacherlors degree from [employer]      Complications   Last HgB A1C per patient/outside source  8.1 %    How often do you check your blood sugar?  0 times/day (not testing)    Have you had a dilated eye exam in the past 12 months?  No    Have you had a dental exam in the past 12 months?  Yes    Are you checking your feet?  No      Dietary Intake   Breakfast  5x week biscuit, sausage & cheese, hashbrown OR once in awhile boiled egg  OR dry cereal    Snack (morning)  none    Lunch  none OR cheese& crackers OR PB crackers OR fruit    Snack (afternoon)  none OR spoonful of PB    Dinner  doesn't feel like eating. frozen dinner OR burger and fries OR yogurt avg 8 pm    Snack (evening)  none    Beverage(s)  water, 1x week Friday ~24 oz, OJ or lemonade, Hint water       Exercise   Exercise Type  Light (walking / raking leaves)    How many days per week to you exercise?  3    How many minutes per day do you exercise?  30    Total minutes per week of exercise  90      Patient Education   Previous Diabetes Education  No    Nutrition management   Role of diet in the treatment of diabetes and the relationship between the three main macronutrients and blood glucose level    Physical activity and exercise   Role of exercise on  diabetes management, blood pressure control and cardiac health.    Monitoring  Taught/evaluated SMBG meter.;Identified appropriate SMBG and/or A1C goals.    Psychosocial adjustment  Role of stress on diabetes      Individualized Goals (developed by patient)   Nutrition  General guidelines for healthy choices and portions discussed    Monitoring   test my blood glucose as discussed      Outcomes   Expected Outcomes  Demonstrated interest in learning. Expect positive outcomes    Future DMSE  4-6 wks    Program Status  Not Completed     Individualized Plan for Diabetes Self-Management Training:   Learning Objective:  Patient will have a greater understanding of diabetes self-management. Patient education plan is to attend individual and/or group sessions per assessed needs and concerns.   Patient Instructions  Plan:  Aim for ~45 or less Carb Choices per meal  Include protein with your meals and snacks Consider reading food labels for Total Carbohydrate Consider increasing your activity level daily as tolerated  If you want to get feedback to see how your changes are affecting your blood sugar you can check your blood sugar 2 hours after a meal. Continue taking medication as directed by MD Consider getting into a regular schedule of meals and sleep, can start with one day per week.  Expected Outcomes:  Demonstrated interest in learning. Expect positive outcomes  Education material provided: My Plate, Snack sheet and Carbohydrate counting sheet  If problems or questions, patient to contact team via:  Phone  Future DSME appointment: 4-6 wks

## 2017-07-14 NOTE — Patient Instructions (Addendum)
Plan:  Aim for ~45 or less Carb Choices per meal  Include protein with your meals and snacks Consider reading food labels for Total Carbohydrate Consider increasing your activity level daily as tolerated  If you want to get feedback to see how your changes are affecting your blood sugar you can check your blood sugar 2 hours after a meal. Continue taking medication as directed by MD Consider getting into a regular schedule of meals and sleep, can start with one day per week.

## 2017-07-17 ENCOUNTER — Encounter: Payer: Self-pay | Admitting: Registered"

## 2017-07-17 DIAGNOSIS — E1165 Type 2 diabetes mellitus with hyperglycemia: Secondary | ICD-10-CM

## 2017-07-17 HISTORY — DX: Type 2 diabetes mellitus with hyperglycemia: E11.65

## 2017-08-18 ENCOUNTER — Encounter: Payer: Managed Care, Other (non HMO) | Attending: Endocrinology | Admitting: Registered"

## 2017-08-18 ENCOUNTER — Encounter: Payer: Managed Care, Other (non HMO) | Admitting: Obstetrics & Gynecology

## 2017-08-18 DIAGNOSIS — Z713 Dietary counseling and surveillance: Secondary | ICD-10-CM | POA: Insufficient documentation

## 2017-08-18 DIAGNOSIS — E119 Type 2 diabetes mellitus without complications: Secondary | ICD-10-CM | POA: Diagnosis present

## 2017-08-18 DIAGNOSIS — E1165 Type 2 diabetes mellitus with hyperglycemia: Secondary | ICD-10-CM

## 2017-08-18 NOTE — Patient Instructions (Signed)
Continue having balanced meals and aim to keep your carbs to 1/4 plate (~16~45 g) Aim to get in exercise regular exercise Continue to keep cutting back on the sweetened beverages

## 2017-08-18 NOTE — Progress Notes (Signed)
Diabetes Self-Management Education  Visit Type: Follow-up  Appt. Start Time: 1400 Appt. End Time: 1430  08/18/2017  [name], identified by name and date of birth, is a 47 y.o. female with a diagnosis of Diabetes: Type 2.   ASSESSMENT Patient states her exercise has decreased due to weather not condusive to walking outside. Pt also states allergies have been work and the weather changes making her aches and pains worse also make her less motivated to exercise. 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  Patient states she has cut back on her sweetened beverages and has been more conscious of her serving size of carbohydrates. Patient states she does not want to prick her finger and is not willing to check her blood sugar. Patient states she will be going back to her doctor in August to have another A1c test and is fine waiting until then to see if her blood sugar is coming down.  Diabetes Self-Management Education - 08/18/17 1403      Visit Information   Visit Type  Follow-up      Initial Visit   Diabetes Type  Type 2      Complications   How often do you check your blood sugar?  0 times/day (not testing)    Have you had a dilated eye exam in the past 12 months?  Yes    Have you had a dental exam in the past 12 months?  Yes      Dietary Intake   Breakfast  eats at work sausage biscuit or just bacon    Snack (morning)  none    Lunch  stuffed chicken with cheese, broccoli pasta side salad    Snack (afternoon)  none    Psychologist, occupationalDinner  chef salad OR bbq meat, turnip greens, baked beans OR late somtething small yogurt    Snack (evening)  none    Beverage(s)  water, [place] lemonade,       Exercise   Exercise Type  Light (walking / raking leaves)    How many days per week to you exercise?  2    How many minutes per day do you exercise?  20    Total minutes per week of exercise  40      Patient Education   Nutrition management   Role of diet in the treatment of diabetes and the relationship between the three main macronutrients and blood glucose level    Physical activity and exercise   Role of exercise on diabetes management, blood pressure control and cardiac health.    Monitoring  Purpose and frequency of SMBG.      Individualized Goals (developed by patient)   Nutrition  General guidelines for healthy choices and portions discussed    Physical Activity  Exercise 3-5 times per week      Patient Self-Evaluation of Goals - Patient rates self as meeting previously set goals (% of time)   Nutrition  50 - 75 %    Physical Activity  --    Monitoring  < 25%      Outcomes   Expected Outcomes  Demonstrated interest in learning. Expect positive outcomes    Future DMSE  PRN    Program Status  Completed      Subsequent Visit   Since your last visit have you had your blood pressure checked?  Yes    Is your most recent  blood pressure lower, unchanged, or higher since your last visit?  Lower    Since your last visit, are you checking your blood glucose at least once a day?  No     Individualized Plan for Diabetes Self-Management Training:   Learning Objective:  Patient will have a greater understanding of diabetes self-management. Patient education plan is to attend individual and/or group sessions per assessed needs and concerns.   Patient Instructions  Continue having balanced meals and aim to keep your carbs to 1/4 plate (~16 g) Aim to get in exercise regular exercise Continue to keep cutting back on the sweetened beverages   Expected Outcomes:  Demonstrated interest in learning. Expect positive outcomes  Education material provided: none  If problems or questions, patient to contact team via:  Phone and MyChart  Future DSME appointment: PRN

## 2017-08-25 ENCOUNTER — Ambulatory Visit (INDEPENDENT_AMBULATORY_CARE_PROVIDER_SITE_OTHER): Payer: Managed Care, Other (non HMO) | Admitting: Obstetrics & Gynecology

## 2017-08-25 ENCOUNTER — Ambulatory Visit: Payer: Managed Care, Other (non HMO)

## 2017-08-25 ENCOUNTER — Encounter: Payer: Self-pay | Admitting: Obstetrics & Gynecology

## 2017-08-25 ENCOUNTER — Ambulatory Visit
Admission: RE | Admit: 2017-08-25 | Discharge: 2017-08-25 | Disposition: A | Discharge status: Home / Self Care | Payer: Managed Care, Other (non HMO) | Source: Ambulatory Visit | Attending: Obstetrics & Gynecology | Admitting: Obstetrics & Gynecology

## 2017-08-25 VITALS — BP 128/76 | Ht 62.0 in | Wt 340.0 lb

## 2017-08-25 DIAGNOSIS — Z3041 Encounter for surveillance of contraceptive pills: Secondary | ICD-10-CM | POA: Diagnosis not present

## 2017-08-25 DIAGNOSIS — Z6841 Body Mass Index (BMI) 40.0 and over, adult: Secondary | ICD-10-CM | POA: Diagnosis not present

## 2017-08-25 DIAGNOSIS — Z1231 Encounter for screening mammogram for malignant neoplasm of breast: Secondary | ICD-10-CM

## 2017-08-25 DIAGNOSIS — N921 Excessive and frequent menstruation with irregular cycle: Secondary | ICD-10-CM

## 2017-08-25 DIAGNOSIS — Z01419 Encounter for gynecological examination (general) (routine) without abnormal findings: Secondary | ICD-10-CM | POA: Diagnosis not present

## 2017-08-25 DIAGNOSIS — E119 Type 2 diabetes mellitus without complications: Secondary | ICD-10-CM | POA: Diagnosis not present

## 2017-08-25 MED ORDER — NORETHINDRONE ACET-ETHINYL EST 1-20 MG-MCG PO TABS: 1.0000 | ORAL_TABLET | Freq: Every day | ORAL | 4 refills | Status: DC

## 2017-08-25 NOTE — Addendum Note (Signed)
Addended by: Berna SpareASTILLO, Laiyla Slagel A on: 08/25/2017 03:37 PM   Modules accepted: Orders

## 2017-08-25 NOTE — Patient Instructions (Signed)
1. Encounter for routine gynecological examination with Papanicolaou smear of cervix Normal gynecologic exam.  Pap reflex done.  Breast exam normal.  Screening mammogram scheduled this afternoon.  Health labs with family physician.  Diabetes mellitus type 2 now followed by Dr. Talmage Nap.  Attempting weight loss on Belviq and seen by nutritionist.  Body mass index 62.19.  2. Encounter for surveillance of contraceptive pills Well on Microgestin 1/20 continuously, except for mild spotting recently.  Will let a withdrawal come x 5 days and restart continuous use after.  No CI to BCPs.  Same BCP represcribed.  3. Breakthrough bleeding on birth control pills Will stop BCPs x 5 days to allow a withdrawal bleeding and then restart continuously.  4. Class 3 severe obesity due to excess calories with serious comorbidity and body mass index (BMI) of 60.0 to 69.9 in adult [facility]) On Belviq with a low calorie diabetic nutrition plan.  Exercising more through her second job.  5. Type 2 diabetes mellitus without complication, without long-term current use of insulin (HCC) On Januvia.  Followed by Dr Talmage Nap since this year.  Other orders - norethindrone-ethinyl estradiol (MICROGESTIN) 1-20 MG-MCG tablet; Take 1 tablet by mouth daily. Continuous use  Monique Fisher, it was a pleasure seeing you today!  I will inform you of your results as soon as they are available.   Calorie Counting for Weight Loss Calories are units of energy. Your body needs a certain amount of calories from food to keep you going throughout the day. When you eat more calories than your body needs, your body stores the extra calories as fat. When you eat fewer calories than your body needs, your body burns fat to get the energy it needs. Calorie counting means keeping track of how many calories you eat and drink each day. Calorie counting can be helpful if you need to lose weight. If you make sure to eat fewer calories than your body needs, you should lose  weight. Ask your health care provider what a healthy weight is for you. For calorie counting to work, you will need to eat the right number of calories in a day in order to lose a healthy amount of weight per week. A dietitian can help you determine how many calories you need in a day and will give you suggestions on how to reach your calorie goal.  A healthy amount of weight to lose per week is usually 1-2 lb (0.5-0.9 kg). This usually means that your daily calorie intake should be reduced by 500-750 calories.  Eating 1,200 - 1,500 calories per day can help most women lose weight.  Eating 1,500 - 1,800 calories per day can help most men lose weight.  What is my plan? My goal is to have __________ calories per day. If I have this many calories per day, I should lose around __________ pounds per week. What do I need to know about calorie counting? In order to meet your daily calorie goal, you will need to:  Find out how many calories are in each food you would like to eat. Try to do this before you eat.  Decide how much of the food you plan to eat.  Write down what you ate and how many calories it had. Doing this is called keeping a food log.  To successfully lose weight, it is important to balance calorie counting with a healthy lifestyle that includes regular activity. Aim for 150 minutes of moderate exercise (such as walking) or 75 minutes  of vigorous exercise (such as running) each week. Where do I find calorie information?  The number of calories in a food can be found on a Nutrition Facts label. If a food does not have a Nutrition Facts label, try to look up the calories online or ask your dietitian for help. Remember that calories are listed per serving. If you choose to have more than one serving of a food, you will have to multiply the calories per serving by the amount of servings you plan to eat. For example, the label on a package of bread might say that a serving size is 1 slice  and that there are 90 calories in a serving. If you eat 1 slice, you will have eaten 90 calories. If you eat 2 slices, you will have eaten 180 calories. How do I keep a food log? Immediately after each meal, record the following information in your food log:  What you ate. Don't forget to include toppings, sauces, and other extras on the food.  How much you ate. This can be measured in cups, ounces, or number of items.  How many calories each food and drink had.  The total number of calories in the meal.  Keep your food log near you, such as in a small notebook in your pocket, or use a mobile app or website. Some programs will calculate calories for you and show you how many calories you have left for the day to meet your goal. What are some calorie counting tips?  Use your calories on foods and drinks that will fill you up and not leave you hungry: ? Some examples of foods that fill you up are nuts and nut butters, vegetables, lean proteins, and high-fiber foods like whole grains. High-fiber foods are foods with more than 5 g fiber per serving. ? Drinks such as sodas, specialty coffee drinks, alcohol, and juices have a lot of calories, yet do not fill you up.  Eat nutritious foods and avoid empty calories. Empty calories are calories you get from foods or beverages that do not have many vitamins or protein, such as candy, sweets, and soda. It is better to have a nutritious high-calorie food (such as an avocado) than a food with few nutrients (such as a bag of chips).  Know how many calories are in the foods you eat most often. This will help you calculate calorie counts faster.  Pay attention to calories in drinks. Low-calorie drinks include water and unsweetened drinks.  Pay attention to nutrition labels for "low fat" or "fat free" foods. These foods sometimes have the same amount of calories or more calories than the full fat versions. They also often have added sugar, starch, or salt, to  make up for flavor that was removed with the fat.  Find a way of tracking calories that works for you. Get creative. Try different apps or programs if writing down calories does not work for you. What are some portion control tips?  Know how many calories are in a serving. This will help you know how many servings of a certain food you can have.  Use a measuring cup to measure serving sizes. You could also try weighing out portions on a kitchen scale. With time, you will be able to estimate serving sizes for some foods.  Take some time to put servings of different foods on your favorite plates, bowls, and cups so you know what a serving looks like.  Try not to eat straight  from a bag or box. Doing this can lead to overeating. Put the amount you would like to eat in a cup or on a plate to make sure you are eating the right portion.  Use smaller plates, glasses, and bowls to prevent overeating.  Try not to multitask (for example, watch TV or use your computer) while eating. If it is time to eat, sit down at a table and enjoy your food. This will help you to know when you are full. It will also help you to be aware of what you are eating and how much you are eating. What are tips for following this plan? Reading food labels  Check the calorie count compared to the serving size. The serving size may be smaller than what you are used to eating.  Check the source of the calories. Make sure the food you are eating is high in vitamins and protein and low in saturated and trans fats. Shopping  Read nutrition labels while you shop. This will help you make healthy decisions before you decide to purchase your food.  Make a grocery list and stick to it. Cooking  Try to cook your favorite foods in a healthier way. For example, try baking instead of frying.  Use low-fat dairy products. Meal planning  Use more fruits and vegetables. Half of your plate should be fruits and vegetables.  Include  lean proteins like poultry and fish. How do I count calories when eating out?  Ask for smaller portion sizes.  Consider sharing an entree and sides instead of getting your own entree.  If you get your own entree, eat only half. Ask for a box at the beginning of your meal and put the rest of your entree in it so you are not tempted to eat it.  If calories are listed on the menu, choose the lower calorie options.  Choose dishes that include vegetables, fruits, whole grains, low-fat dairy products, and lean protein.  Choose items that are boiled, broiled, grilled, or steamed. Stay away from items that are buttered, battered, fried, or served with cream sauce. Items labeled "crispy" are usually fried, unless stated otherwise.  Choose water, low-fat milk, unsweetened iced tea, or other drinks without added sugar. If you want an alcoholic beverage, choose a lower calorie option such as a glass of wine or light beer.  Ask for dressings, sauces, and syrups on the side. These are usually high in calories, so you should limit the amount you eat.  If you want a salad, choose a garden salad and ask for grilled meats. Avoid extra toppings like bacon, cheese, or fried items. Ask for the dressing on the side, or ask for olive oil and vinegar or lemon to use as dressing.  Estimate how many servings of a food you are given. For example, a serving of cooked rice is  cup or about the size of half a baseball. Knowing serving sizes will help you be aware of how much food you are eating at restaurants. The list below tells you how big or small some common portion sizes are based on everyday objects: ? 1 oz-4 stacked dice. ? 3 oz-1 deck of cards. ? 1 tsp-1 die. ? 1 Tbsp- a ping-pong ball. ? 2 Tbsp-1 ping-pong ball. ?  cup- baseball. ? 1 cup-1 baseball. Summary  Calorie counting means keeping track of how many calories you eat and drink each day. If you eat fewer calories than your body needs, you should  lose  weight.  A healthy amount of weight to lose per week is usually 1-2 lb (0.5-0.9 kg). This usually means reducing your daily calorie intake by 500-750 calories.  The number of calories in a food can be found on a Nutrition Facts label. If a food does not have a Nutrition Facts label, try to look up the calories online or ask your dietitian for help.  Use your calories on foods and drinks that will fill you up, and not on foods and drinks that will leave you hungry.  Use smaller plates, glasses, and bowls to prevent overeating. This information is not intended to replace advice given to you by your health care provider. Make sure you discuss any questions you have with your health care provider. Document Released: 02/25/2005 Document Revised: 01/26/2016 Document Reviewed: 01/26/2016 Elsevier Interactive Patient Education  [employer].

## 2017-08-25 NOTE — Progress Notes (Signed)
Monique Fisher [birth year] [id number]   History:    47 y.o. G0 Boyfriend x almost 2 yrs, he has a 82 yo son.  RP:  Established patient presenting for annual gyn exam   HPI: Well on Microgestin 1/20 continuous use, but had some mild breakthrough bleeding the last couple of packs.  No pelvic pain.  Normal vaginal secretions.  Sexually active with no pain.  Using condoms.  Urine and bowel movements normal.  Breasts normal.  Screening mammogram scheduled today.  Type 2 diabetes mellitus on Januvia now followed by Dr. Talmage Nap.  Obesity with body mass index at 62.19.  On a low calorie/carb diabetic diet after seeing nutritionist and also taking Belviq.  Increase physical activity with her second job which involves a lot of walking.  Health labs with family physician.  Past medical history,surgical history, family history and social history were all reviewed and documented in the EPIC chart.  Gynecologic History No LMP recorded. (Menstrual status: Oral contraceptives). Contraception: OCP (estrogen/progesterone) Last Pap: 08/2016. Results were: Negative, HPV HR neg.  But low TZ cells. Last mammogram: 08/2016. Results were: Negative Bone Density: Never Colonoscopy: Never  Obstetric History OB History  Gravida Para Term Preterm AB Living  0 0 0 0 0 0  SAB TAB Ectopic Multiple Live Births  0 0 0 0 0     ROS: A ROS was performed and pertinent positives and negatives are included in the history.  GENERAL: No fevers or chills. HEENT: No change in vision, no earache, sore throat or sinus congestion. NECK: No pain or stiffness. CARDIOVASCULAR: No chest pain or pressure. No palpitations. PULMONARY: No shortness of breath, cough or wheeze. GASTROINTESTINAL: No abdominal pain, nausea, vomiting or diarrhea, melena or bright red blood per rectum. GENITOURINARY: No urinary frequency, urgency, hesitancy or dysuria. MUSCULOSKELETAL: No joint or muscle pain, no back pain, no recent trauma. DERMATOLOGIC: No rash, no  itching, no lesions. ENDOCRINE: No polyuria, polydipsia, no heat or cold intolerance. No recent change in weight. HEMATOLOGICAL: No anemia or easy bruising or bleeding. NEUROLOGIC: No headache, seizures, numbness, tingling or weakness. PSYCHIATRIC: No depression, no loss of interest in normal activity or change in sleep pattern.     Exam:   BP 128/76   Ht 5\' 2"  (1.575 m)   Wt (!) 340 lb (154.2 kg)   BMI 62.19 kg/m   Body mass index is 62.19 kg/m.  General appearance : Well developed well nourished female. No acute distress HEENT: Eyes: no retinal hemorrhage or exudates,  Neck supple, trachea midline, no carotid bruits, no thyroidmegaly Lungs: Clear to auscultation, no rhonchi or wheezes, or rib retractions  Heart: Regular rate and rhythm, no murmurs or gallops Breast:Examined in sitting and supine position were symmetrical in appearance, no palpable masses or tenderness,  no skin retraction, no nipple inversion, no nipple discharge, no skin discoloration, no axillary or supraclavicular lymphadenopathy Abdomen: no palpable masses or tenderness, no rebound or guarding Extremities: no edema or skin discoloration or tenderness  Pelvic: Vulva: Normal             Vagina: No gross lesions or discharge  Cervix: No gross lesions or discharge.  Pap reflex done.  Uterus  AV, normal size, shape and consistency, non-tender and mobile  Adnexa  Without masses or tenderness  Anus: Normal   Assessment/Plan:  47 y.o. female for annual exam   1. Encounter for routine gynecological examination with Papanicolaou smear of cervix Normal gynecologic exam.  Pap reflex done.  Breast exam normal.  Screening mammogram scheduled this afternoon.  Health labs with family physician.  Diabetes mellitus type 2 now followed by Dr. Talmage NapBalan.  Attempting weight loss on Belviq and seen by nutritionist.  Body mass index 62.19.  2. Encounter for surveillance of contraceptive pills Well on Microgestin 1/20 continuously,  except for mild spotting recently.  Will let a withdrawal come x 5 days and restart continuous use after.  No CI to BCPs.  Same BCP represcribed.  3. Breakthrough bleeding on birth control pills Will stop BCPs x 5 days to allow a withdrawal bleeding and then restart continuously.  4. Class 3 severe obesity due to excess calories with serious comorbidity and body mass index (BMI) of 60.0 to 69.9 in adult [facility](HCC) On Belviq with a low calorie diabetic nutrition plan.  Exercising more through her second job.  5. Type 2 diabetes mellitus without complication, without long-term current use of insulin (HCC) On Januvia.  Followed by Dr Talmage NapBalan since this year.  Other orders - norethindrone-ethinyl estradiol (MICROGESTIN) 1-20 MG-MCG tablet; Take 1 tablet by mouth daily. Continuous use  Counseling on above issues and coordination of care more than 50% for 10 minutes.  Genia DelMarie-Lyne Servando Kyllonen MD, 2:53 PM 08/25/2017

## 2017-08-27 LAB — PAP IG W/ RFLX HPV ASCU

## 2017-10-13 ENCOUNTER — Encounter: Payer: Self-pay | Admitting: Allergy

## 2017-10-13 ENCOUNTER — Ambulatory Visit: Payer: Managed Care, Other (non HMO) | Admitting: Allergy

## 2017-10-13 VITALS — BP 132/78 | HR 76 | Resp 14

## 2017-10-13 DIAGNOSIS — Z91018 Allergy to other foods: Secondary | ICD-10-CM

## 2017-10-13 DIAGNOSIS — J4520 Mild intermittent asthma, uncomplicated: Secondary | ICD-10-CM | POA: Diagnosis not present

## 2017-10-13 DIAGNOSIS — J309 Allergic rhinitis, unspecified: Secondary | ICD-10-CM | POA: Diagnosis not present

## 2017-10-13 DIAGNOSIS — H1010 Acute atopic conjunctivitis, unspecified eye: Secondary | ICD-10-CM | POA: Diagnosis not present

## 2017-10-13 MED ORDER — OLOPATADINE HCL 0.2 % OP SOLN: 1.0000 [drp] | Freq: Every day | OPHTHALMIC | 5 refills | Status: AC

## 2017-10-13 MED ORDER — MONTELUKAST SODIUM 10 MG PO TABS: 10.0000 mg | ORAL_TABLET | Freq: Every day | ORAL | 5 refills | Status: DC

## 2017-10-13 MED ORDER — IPRATROPIUM BROMIDE 0.06 % NA SOLN: 2.0000 | Freq: Four times a day (QID) | NASAL | 5 refills | Status: DC

## 2017-10-13 NOTE — Patient Instructions (Addendum)
Allergic rhinoconjunctivitis   - Zyrtec 10mg  daily (make take additional dose if needed)    - Continue Singulair 10mg  in the evening    - change nasal spray to nasal atrovent 2 sprays each nostril up to 3-4 times a day as needed for control of nasal drainage    -Continue Pataday 1 drop each eye as needed for itchy/watery/red eyes (if your insurance covers [facility] will order this option)    -Saline nasal wash each evening and as needed.   Mild intermittent asthma  - Use Xopenex 2 puffs every 4-6 hours as needed for cough, wheeze, shortness of breath  - Continue singulair Asthma control goals:   Full participation in all desired activities (may need albuterol before activity)  Albuterol use two time or less a week on average (not counting use with activity)  Cough interfering with sleep two time or less a month  Oral steroids no more than once a year  No hospitalizations   Food allergy   - Continue avoidance of shellfish and fish   - Epipen/Benadryl as needed for allergic reaction  Follow-up in 6-9 months or sooner if needed.

## 2017-10-13 NOTE — Progress Notes (Signed)
Follow-up Note  RE: Monique Fisher MRN: [id number] DOB: [birth year] Date of Office Visit: 10/13/2017   History of present illness: Monique Fisher is a 47 y.o. female presenting today for follow-up of allergic rhinoconjunctivitis, asthma and food allergy.  She was last seen in the office on 04/14/17 by myself.  Since this visit she denies any major health changes, surgeries or hospitalizations.  She states she will intermittently have a lots of nasal drainage to the point where she feels sick to her stomach due to swallowing the drainage.  She has not been able to identify any triggering events to her increased nasal drainage.  She does take Zyrtec and sometimes will take an additional dose if needed especially if having a lot of nasal drainage.  She normally will take however a Benadryl and expects about 30 minutes later her drainage will subside.  She did trial Xyzal but states it was too expensive for her to keep taking.  She has not used her Nasonex since her last visit.  She does continue on Singulair daily.  She also has Pataday which she states helps but sometimes her itchy watery eyes and not relieved with Pataday alone.  She states she has tried Patanase in the past and seemed to make her nasal drainage worse.  She does states she was told she has dry eye by her eye doctor and was recommended to use artificial tears. With her asthma she denies any symptoms or flares requiring ED or urgent care visits or any oral steroid needs.  She states she has not used her Xopenex since February.  She denies any nighttime awakenings. She continues to avoid shellfish and fish without any accidental ingestions or need to use her EpiPen.   Review of systems: Review of Systems  Constitutional: Negative for chills, fever and malaise/fatigue.  HENT: Positive for congestion. Negative for ear discharge, ear pain, nosebleeds, sinus pain and sore throat.   Eyes: Negative for pain, discharge and redness.  Respiratory:  Negative for cough, shortness of breath and wheezing.   Cardiovascular: Negative for chest pain.  Gastrointestinal: Negative for abdominal pain, constipation, diarrhea, nausea and vomiting.  Musculoskeletal: Negative for joint pain.  Skin: Negative for itching and rash.  Neurological: Negative for headaches.    All other systems negative unless noted above in HPI  Past medical/social/surgical/family history have been reviewed and are unchanged unless specifically indicated below.  No changes  Medication List: Allergies as of 10/13/2017      Reactions   Amlodipine    Other reaction(s): NO REACTION   Amoxicillin    Erythromycin    "SKIN CRAWLING"   Fluticasone    Iodinated Diagnostic Agents    Iohexol     Code: HIVES, Desc: pt states hive w/ct contrast, does fine w/13hr prep, Onset Date: [id number]  Code: HIVES, Desc: pt states hive w/ct contrast, does fine w/13hr prep, Onset Date: [id number]   Latex    Lisinopril    NIGHT SWEATS, COUGH   Penicillins    Phentermine Hypertension   Shellfish-derived Products    Sulfa Antibiotics       Medication List        Accurate as of 10/13/17  5:30 PM. Always use your most recent med list.          acetaminophen 500 MG tablet Commonly known as:  TYLENOL Take 1,000 mg by mouth 2 (two) times daily.   aspirin 81 MG tablet Take 81 mg by mouth at bedtime.  BELVIQ XR 20 MG Tb24 Generic drug:  Lorcaserin HCl ER Take by mouth.   benzoyl peroxide 10 % Liqd Generic drug:  Benzoyl Peroxide Apply topically.   cetirizine 10 MG chewable tablet Commonly known as:  ZYRTEC Chew 10 mg by mouth daily.   cholecalciferol 1000 units tablet Commonly known as:  VITAMIN D Take 3,000 Units by mouth daily.   CULTURELLE PO Take by mouth.   diphenhydrAMINE 12.5 MG chewable tablet Commonly known as:  BENADRYL Chew 25 mg by mouth 4 (four) times daily as needed for allergies.   fenofibrate 160 MG tablet Take 160 mg by mouth daily.   gabapentin  300 MG capsule Commonly known as:  NEURONTIN Take 600 mg by mouth 4 (four) times daily.   glucosamine-chondroitin 500-400 MG tablet Take 1 tablet by mouth every morning. Reported on 07/28/2015   ibuprofen 800 MG tablet Commonly known as:  ADVIL,MOTRIN   levalbuterol 45 MCG/ACT inhaler Commonly known as:  XOPENEX HFA Inhale 2 puffs into the lungs every 6 (six) hours as needed for wheezing.   losartan 100 MG tablet Commonly known as:  COZAAR Take 100 mg by mouth daily.   metoprolol succinate 50 MG 24 hr tablet Commonly known as:  TOPROL-XL Take 50 mg by mouth daily. Take with or immediately following a meal.   montelukast 10 MG tablet Commonly known as:  SINGULAIR Take 1 tablet (10 mg total) by mouth at bedtime.   multivitamin with minerals tablet Take 1 tablet by mouth daily.   norethindrone-ethinyl estradiol 1-20 MG-MCG tablet Commonly known as:  MICROGESTIN Take 1 tablet by mouth daily. Continuous use   Olopatadine HCl 0.2 % Soln Commonly known as:  PATADAY Place 1 drop into both eyes 1 day or 1 dose.   omeprazole 20 MG capsule Commonly known as:  PRILOSEC Take 20 mg by mouth daily.   ondansetron 4 MG disintegrating tablet Commonly known as:  ZOFRAN-ODT   pyridOXINE 100 MG tablet Commonly known as:  VITAMIN B-6 Take 100 mg by mouth daily.   sitaGLIPtin 100 MG tablet Commonly known as:  JANUVIA Take 100 mg by mouth daily.   TART CHERRY ADVANCED PO Take 1,200 mg by mouth.   vitamin B-12 1000 MCG tablet Commonly known as:  CYANOCOBALAMIN Take 1,000 mcg by mouth 2 (two) times a week.   ZOMIG 2.5 MG tablet Generic drug:  ZOLMitriptan Take 2.5 mg by mouth once. May repeat in 2 hours if headache persists or recurs.       Known medication allergies: Allergies  Allergen Reactions  . Amlodipine     Other reaction(s): NO REACTION  . Amoxicillin   . Erythromycin     "SKIN CRAWLING"  . Fluticasone   . Iodinated Diagnostic Agents   . Iohexol      Code:  HIVES, Desc: pt states hive w/ct contrast, does fine w/13hr prep, Onset Date: [id number]  Code: HIVES, Desc: pt states hive w/ct contrast, does fine w/13hr prep, Onset Date: [id number]   . Latex   . Lisinopril     NIGHT SWEATS, COUGH   . Penicillins   . Phentermine Hypertension  . Shellfish-Derived Products   . Sulfa Antibiotics      Physical examination: Blood pressure 132/78, pulse 76, resp. rate 14, SpO2 95 %.  General: Alert, interactive, in no acute distress, obese. HEENT: PERRLA, TMs pearly gray, turbinates mildly edematous without discharge, post-pharynx non erythematous. Neck: Supple without lymphadenopathy. Lungs: Clear to auscultation without wheezing, rhonchi or rales. {no increased  work of breathing. CV: Normal S1, S2 without murmurs. Abdomen: Nondistended, nontender. Skin: Warm and dry, without lesions or rashes. Extremities:  No clubbing, cyanosis or edema. Neuro:   Grossly intact.  Diagnositics/Labs:  Spirometry: FEV1: 2.28L 84%, FVC: 2.78L 82%, ratio consistent with Nonobstructive pattern  Assessment and plan:   Allergic rhinoconjunctivitis   - Zyrtec 10mg  daily (make take additional dose if needed)    - Continue Singulair 10mg  in the evening    -  change nasal spray to nasal atrovent 2 sprays each nostril up to 3-4 times a day as needed for control of nasal drainage    -Continue Pataday 1 drop each eye as needed for itchy/watery/red eyes (if your insurance covers [facility] will order this option)    -Saline nasal wash each evening and as needed.   Mild intermittent asthma   - Use Xopenex 2 puffs every 4-6 hours as needed for cough, wheeze, shortness of breath  -  Continue singulair Asthma control goals:   Full participation in all desired activities (may need albuterol before activity)  Albuterol use two time or less a week on average (not counting use with activity)  Cough interfering with sleep two time or less a month  Oral steroids no more than once a  year  No hospitalizations   Food allergy   - Continue avoidance of shellfish and fish   - Epipen/Benadryl as needed for allergic reaction  Follow-up in 6-9 months or sooner if needed.  I appreciate the opportunity to take part in Monique Fisher's care. Please do not hesitate to contact me with questions.  Sincerely,   Margo Aye, MD Allergy/Immunology [facility]

## 2017-12-29 ENCOUNTER — Telehealth: Payer: Self-pay | Admitting: *Deleted

## 2017-12-29 NOTE — Telephone Encounter (Signed)
Patient takes Microgestin 1/20 continuous use,but has noticed more breakthrough bleeding monthly since August, stopped pills x 5 days in August to have a cycle and 2 weeks later bleeding started again, takes daily on time. Transferred to appointment desk to schedule OV.

## 2017-12-30 ENCOUNTER — Ambulatory Visit: Payer: Managed Care, Other (non HMO) | Admitting: Obstetrics & Gynecology

## 2017-12-30 ENCOUNTER — Encounter: Payer: Self-pay | Admitting: Obstetrics & Gynecology

## 2017-12-30 DIAGNOSIS — N921 Excessive and frequent menstruation with irregular cycle: Secondary | ICD-10-CM

## 2017-12-30 DIAGNOSIS — Z113 Encounter for screening for infections with a predominantly sexual mode of transmission: Secondary | ICD-10-CM | POA: Diagnosis not present

## 2017-12-30 MED ORDER — NORETHIN ACE-ETH ESTRAD-FE 1.5-30 MG-MCG PO TABS: 1.0000 | ORAL_TABLET | Freq: Every day | ORAL | 4 refills | Status: DC

## 2017-12-30 NOTE — Progress Notes (Signed)
    Monique Fisher [birth year] [id number]        47 y.o.  G0 stable boyfriend  RP: Menometrorrhagia with dysmenorrhea on birth control pills with migraines x several months  HPI: Patient was having mild breakthrough bleeding on continuous Microgestin 1/20 until June 2019.  Since then, the breakthrough bleeding has increased to the point where she is now bleeding more often than not.  She has tried to let a period come monthly, but still having frequent breakthrough bleeding nonetheless.  Experiences migraines without aura or other neurologic symptoms when bleeding.  Severe cramping when bleeding but no pelvic pain otherwise.   OB History  Gravida Para Term Preterm AB Living  0 0 0 0 0 0  SAB TAB Ectopic Multiple Live Births  0 0 0 0 0    Past medical history,surgical history, problem list, medications, allergies, family history and social history were all reviewed and documented in the EPIC chart.   Directed ROS with pertinent positives and negatives documented in the history of present illness/assessment and plan.  Exam:  There were no vitals filed for this visit. General appearance:  Normal  Abdomen: Normal  Gynecologic exam: Vulva normal.  Speculum: Cervix and vagina normal.  Normal vaginal secretions.  Bimanual exam: Anteverted uterus, normal volume, mobile, nontender.  No adnexal mass felt, nontender bilaterally.   Assessment/Plan:  47 y.o. G0P0000   1. Menometrorrhagia Worsening menometrorrhagia on birth control pill with Microgestin 1/20 continuous use.  Decision to change to Microgestin FE 1.5/30.  Will let a period come as needed.  Rule out anemia and endocrine dysfunction with labs today.  Follow-up with a pelvic ultrasound to rule out endometrial pathology such as polyps, fibroids, endometrial hyperplasia and endometrial cancer. - CBC - TSH - Prolactin - US Transvaginal Non-OB; Future  2. Breakthrough bleeding on birth control pills As above. - [place] Transvaginal  Non-OB; Future  Other orders - norethindrone-ethinyl estradiol-iron (MICROGESTIN FE 1.5/30) 1.5-30 MG-MCG tablet; Take 1 tablet by mouth daily. Continuous use  Counseling on above issues and coordination of care more than 50% for 25 minutes.  Genia Del MD, 4:23 PM 12/30/2017

## 2017-12-31 LAB — CBC
HCT: 44.8 % (ref 35.0–45.0)
HEMOGLOBIN: 15.2 g/dL (ref 11.7–15.5)
MCH: 29.8 pg (ref 27.0–33.0)
MCHC: 33.9 g/dL (ref 32.0–36.0)
MCV: 87.8 fL (ref 80.0–100.0)
MPV: 10.3 fL (ref 7.5–12.5)
Platelets: 408 10*3/uL — ABNORMAL HIGH (ref 140–400)
RBC: 5.1 10*6/uL (ref 3.80–5.10)
RDW: 12.6 % (ref 11.0–15.0)
WBC: 13.1 10*3/uL — AB (ref 3.8–10.8)

## 2017-12-31 LAB — TSH: TSH: 2.29 mIU/L

## 2017-12-31 LAB — PROLACTIN: PROLACTIN: 11 ng/mL

## 2018-01-01 LAB — C. TRACHOMATIS/N. GONORRHOEAE RNA
C. trachomatis RNA, TMA: NOT DETECTED
N. GONORRHOEAE RNA, TMA: NOT DETECTED

## 2018-01-02 ENCOUNTER — Encounter: Payer: Self-pay | Admitting: Obstetrics & Gynecology

## 2018-01-02 NOTE — Patient Instructions (Signed)
1. Menometrorrhagia Worsening menometrorrhagia on birth control pill with Microgestin 1/20 continuous use.  Decision to change to Microgestin FE 1.5/30.  Will let a period come as needed.  Rule out anemia and endocrine dysfunction with labs today.  Follow-up with a pelvic ultrasound to rule out endometrial pathology such as polyps, fibroids, endometrial hyperplasia and endometrial cancer. - CBC - TSH - Prolactin - US Transvaginal Non-OB; Future  2. Breakthrough bleeding on birth control pills As above. - [place] Transvaginal Non-OB; Future  Other orders - norethindrone-ethinyl estradiol-iron (MICROGESTIN FE 1.5/30) 1.5-30 MG-MCG tablet; Take 1 tablet by mouth daily. Continuous use  Monique Fisher, it was a pleasure seeing you today!  I will inform you of your results as soon as they are available.

## 2018-02-02 ENCOUNTER — Ambulatory Visit (INDEPENDENT_AMBULATORY_CARE_PROVIDER_SITE_OTHER): Payer: Managed Care, Other (non HMO)

## 2018-02-02 ENCOUNTER — Encounter: Payer: Self-pay | Admitting: Obstetrics & Gynecology

## 2018-02-02 ENCOUNTER — Ambulatory Visit: Payer: Managed Care, Other (non HMO) | Admitting: Obstetrics & Gynecology

## 2018-02-02 VITALS — BP 130/80

## 2018-02-02 DIAGNOSIS — N921 Excessive and frequent menstruation with irregular cycle: Secondary | ICD-10-CM

## 2018-02-02 DIAGNOSIS — Z6841 Body Mass Index (BMI) 40.0 and over, adult: Secondary | ICD-10-CM | POA: Diagnosis not present

## 2018-02-02 NOTE — Patient Instructions (Signed)
1. Menometrorrhagia Birth control pills changed to the generic of Loestrin 1.5/30 which was started on December 30, 2017.  Patient doing well on it with no vaginal bleeding at this time.  Pelvic ultrasound findings reviewed with patient and reassurance given that the endometrium is thin and normal at 6.4 mm.  Uterus normal and no adnexal mass.  We will continue to control the cycle with the new birth control pill.  Follow-up at her annual gynecologic exam.  2. Class 3 severe obesity due to excess calories without serious comorbidity with body mass index (BMI) of 60.0 to 69.9 in adult [facility](HCC) Patient encouraged to go on a low calorie/low carb diet such as [employer] diet.  Recommend aerobic physical activities 5 times a week and weightlifting every 2 days.  Misty StanleyLisa, it was a pleasure seeing you today!

## 2018-02-02 NOTE — Progress Notes (Signed)
    Monique Fisher Monique Fisher, Monique Fisher [id number]        47 y.o.  G0 Single  RP: Menometrorrhagia for Pelvic US  HPI: Had menometrorrhagia on Junel 1/20, switched to Junel 1.5/30 on 12/30/2017.  Well on it with no vaginal bleeding currently.  No pelvic pain.  Abstinent currently.   OB History  Gravida Para Term Preterm AB Living  0 0 0 0 0 0  SAB TAB Ectopic Multiple Live Births  0 0 0 0 0    Past medical history,surgical history, problem list, medications, allergies, family history and social history were all reviewed and documented in the EPIC chart.   Directed ROS with pertinent positives and negatives documented in the history of present illness/assessment and plan.  Exam:  Vitals:   02/02/18 0919  BP: 130/80   General appearance:  Normal  Pelvic US today: T/V and T/a images.  Uterus anteverted homogeneous measuring 7.17 x 6.09 x 3.12 cm.  Endometrial line echogenic with no defect seen and normal thinness at 6.4 mm.  Right ovary normal.  Left ovary not seen.  But no apparent mass in the right or left adnexa and no free fluid in the posterior cul-de-sac.  Labs 12/30/2017: Hb 15.2, Prolactin normal at 11, TSH normal at 2.29.   Assessment/Plan:  47 y.o. G0  1. Menometrorrhagia Birth control pills changed to the generic of Loestrin 1.5/30 which was started on December 30, 2017.  Patient doing well on it with no vaginal bleeding at this time.  Pelvic ultrasound findings reviewed with patient and reassurance given that the endometrium is thin and normal at 6.4 mm.  Uterus normal and no adnexal mass.  We will continue to control the cycle with the new birth control pill.  Follow-up at her annual gynecologic exam.  2. Class 3 severe obesity due to excess calories without serious comorbidity with body mass index (BMI) of 60.0 to 69.9 in adult Monique Fisher(HCC) Patient encouraged to go on a low calorie/low carb diet such as [employer] diet.  Recommend aerobic physical activities 5 times a week and  weightlifting every 2 days.  Counseling on above issues and coordination of care more than 50% for 15 minutes.  Genia DelMarie-Lyne Irja Wheless MD, 9:24 AM 02/02/2018

## 2018-03-18 DIAGNOSIS — T50905A Adverse effect of unspecified drugs, medicaments and biological substances, initial encounter: Secondary | ICD-10-CM | POA: Diagnosis not present

## 2018-04-14 DIAGNOSIS — E781 Pure hyperglyceridemia: Secondary | ICD-10-CM | POA: Diagnosis not present

## 2018-04-14 DIAGNOSIS — E1165 Type 2 diabetes mellitus with hyperglycemia: Secondary | ICD-10-CM | POA: Diagnosis not present

## 2018-04-21 ENCOUNTER — Encounter: Payer: Self-pay | Admitting: Allergy

## 2018-04-21 ENCOUNTER — Ambulatory Visit (INDEPENDENT_AMBULATORY_CARE_PROVIDER_SITE_OTHER): Payer: BLUE CROSS/BLUE SHIELD | Admitting: Allergy

## 2018-04-21 VITALS — BP 122/82 | HR 87 | Resp 18 | Ht 61.75 in | Wt 319.8 lb

## 2018-04-21 DIAGNOSIS — R200 Anesthesia of skin: Secondary | ICD-10-CM | POA: Diagnosis not present

## 2018-04-21 DIAGNOSIS — T7800XD Anaphylactic reaction due to unspecified food, subsequent encounter: Secondary | ICD-10-CM

## 2018-04-21 DIAGNOSIS — M25551 Pain in right hip: Secondary | ICD-10-CM | POA: Diagnosis not present

## 2018-04-21 DIAGNOSIS — H1013 Acute atopic conjunctivitis, bilateral: Secondary | ICD-10-CM

## 2018-04-21 DIAGNOSIS — M5431 Sciatica, right side: Secondary | ICD-10-CM | POA: Diagnosis not present

## 2018-04-21 DIAGNOSIS — E781 Pure hyperglyceridemia: Secondary | ICD-10-CM | POA: Diagnosis not present

## 2018-04-21 DIAGNOSIS — J4520 Mild intermittent asthma, uncomplicated: Secondary | ICD-10-CM

## 2018-04-21 DIAGNOSIS — I10 Essential (primary) hypertension: Secondary | ICD-10-CM | POA: Diagnosis not present

## 2018-04-21 DIAGNOSIS — E041 Nontoxic single thyroid nodule: Secondary | ICD-10-CM | POA: Diagnosis not present

## 2018-04-21 DIAGNOSIS — J3089 Other allergic rhinitis: Secondary | ICD-10-CM | POA: Diagnosis not present

## 2018-04-21 DIAGNOSIS — L270 Generalized skin eruption due to drugs and medicaments taken internally: Secondary | ICD-10-CM

## 2018-04-21 DIAGNOSIS — G5711 Meralgia paresthetica, right lower limb: Secondary | ICD-10-CM | POA: Diagnosis not present

## 2018-04-21 DIAGNOSIS — E1165 Type 2 diabetes mellitus with hyperglycemia: Secondary | ICD-10-CM | POA: Diagnosis not present

## 2018-04-21 MED ORDER — EPIPEN 2-PAK 0.3 MG/0.3ML IJ SOAJ: INTRAMUSCULAR | 4 refills | Status: DC

## 2018-04-21 MED ORDER — MONTELUKAST SODIUM 10 MG PO TABS: 10.0000 mg | ORAL_TABLET | Freq: Every day | ORAL | 5 refills | Status: AC

## 2018-04-21 MED ORDER — IPRATROPIUM BROMIDE 0.06 % NA SOLN: 2.0000 | Freq: Four times a day (QID) | NASAL | 5 refills | Status: AC

## 2018-04-21 NOTE — Progress Notes (Signed)
Follow-up Note  RE: Monique CalixLisa Mcconville MRN: [id number] DOB: 1970/08/19 Date of Office Visit: 04/21/2018   History of present illness: Monique Fisher is a 48 y.o. female presenting today for follow-up of allergic rhinitis with conjunctivitis, asthma and food allergy.   She was last seen in the office on 04/14/17 by myself in our [place] office.  She has had an eventful start to the new year but has not required any hospitalizations.    She was prescribed dilantin by her neurologist to go along with her gabapentin for control of leg paresthesias.  She states she had been taking dilantin for about 2 months before she developed hives.  She states she first started breaking out in her underarm area and it spread to involved her chest, breast, abdomen, legs.   She discussed with her PCP who thought it was related to dilantin use.  She was prescribed prednisone.  She states she required 2 rounds of prednisone before she saw her PCP on 03/18/2018 at which time she was recommended to stop the dilantin.  She states she did required a 3 round of prednisone with a taper that she completed around 03/29/2018.  She states the hives resolved with stopping the dilantin.  She states the rash was very itchy.  She states she was taking pepcid as well.   She states she is continuing on gabapentin and going back on meloxicam and using a topical capzaicin.    She also states in January she had an episode where she states she "shed" her "nasal lining".  She states she felt like she had a "booger" in her nose that she couldn't blow out.  She states one morning she blew hard enough and she blew out white looking mucus that look like "white piece of skin".  She states it was not bloody.  Separately she does states that she has been having quick resolving nosebleeds.  She has not been using an nasal sprays at this time.  She never got the nasal atrovent recommended after last visit.  She has been taking zyrtec and singulair daily.    She also  states she has been having a sore tongue and did see dentist today and was prescribed a peroxide type mouth wash to use at this time.    She will be starting new diabetes medication tresiba.    Review of systems: Review of Systems  Constitutional: Negative for chills, fever and malaise/fatigue.  HENT: Positive for congestion and nosebleeds. Negative for ear discharge, ear pain, sinus pain and sore throat.   Eyes: Negative for pain, discharge and redness.  Respiratory: Negative for cough, shortness of breath and wheezing.   Cardiovascular: Negative for chest pain.  Gastrointestinal: Negative for abdominal pain, constipation, diarrhea, nausea and vomiting.  Musculoskeletal: Negative for myalgias.  Skin: Positive for itching and rash.  Neurological: Positive for tingling. Negative for headaches.    All other systems negative unless noted above in HPI  Past medical/social/surgical/family history have been reviewed and are unchanged unless specifically indicated below.  No changes  Medication List: Allergies as of 04/21/2018      Reactions   Dilantin  [phenytoin Sodium Extended] Hives   Amlodipine    Other reaction(s): NO REACTION   Amoxicillin    Erythromycin    "SKIN CRAWLING"   Fluticasone    Iodinated Diagnostic Agents    Iohexol     Code: HIVES, Desc: pt states hive w/ct contrast, does fine w/13hr prep, Onset Date: [id number]  Code: HIVES, Desc: pt states hive w/ct contrast, does fine w/13hr prep, Onset Date: [id number]   Lisinopril    NIGHT SWEATS, COUGH   Penicillins    Phentermine Hypertension   Shellfish-derived Products    Sulfa Antibiotics    Latex Hives, Rash      Medication List       Accurate as of April 21, 2018  5:10 PM. Always use your most recent med list.        aspirin 81 MG tablet Take 81 mg by mouth at bedtime.   benzoyl peroxide 10 % Liqd Generic drug:  Benzoyl Peroxide Apply topically.   cetirizine 10 MG chewable tablet Commonly known as:   ZYRTEC Chew 10 mg by mouth daily.   cholecalciferol 1000 units tablet Commonly known as:  VITAMIN D Take 3,000 Units by mouth daily.   CULTURELLE PO Take by mouth.   diphenhydrAMINE 12.5 MG chewable tablet Commonly known as:  BENADRYL Chew 25 mg by mouth 4 (four) times daily as needed for allergies.   EPIPEN 2-PAK 0.3 mg/0.3 mL Soaj injection Generic drug:  EPINEPHrine Use for life-threatening allergic reactions   fenofibrate 160 MG tablet Take 160 mg by mouth daily.   gabapentin 300 MG capsule Commonly known as:  NEURONTIN Take 600 mg by mouth 4 (four) times daily.   glucosamine-chondroitin 500-400 MG tablet Take 1 tablet by mouth every morning. Reported on 07/28/2015   ipratropium 0.06 % nasal spray Commonly known as:  ATROVENT Place 2 sprays into both nostrils 4 (four) times daily.   levalbuterol 45 MCG/ACT inhaler Commonly known as:  XOPENEX HFA Inhale 2 puffs into the lungs every 6 (six) hours as needed for wheezing.   losartan 100 MG tablet Commonly known as:  COZAAR Take 100 mg by mouth daily.   meloxicam 15 MG tablet Commonly known as:  MOBIC Take by mouth.   metoprolol succinate 50 MG 24 hr tablet Commonly known as:  TOPROL-XL Take 50 mg by mouth daily. Take with or immediately following a meal.   montelukast 10 MG tablet Commonly known as:  SINGULAIR Take 1 tablet (10 mg total) by mouth at bedtime.   multivitamin with minerals tablet Take 1 tablet by mouth daily.   norethindrone-ethinyl estradiol 1-20 MG-MCG tablet Commonly known as:  JUNEL FE,GILDESS FE,LOESTRIN FE Take by mouth.   Olopatadine HCl 0.2 % Soln Commonly known as:  PATADAY Place 1 drop into both eyes daily.   omeprazole 20 MG capsule Commonly known as:  PRILOSEC Take 20 mg by mouth daily.   ondansetron 4 MG disintegrating tablet Commonly known as:  ZOFRAN-ODT   OZEMPIC (0.25 OR 0.5 MG/DOSE) Vance Inject into the skin.   pyridOXINE 100 MG tablet Commonly known as:  VITAMIN  B-6 Take 100 mg by mouth daily.   sitaGLIPtin 100 MG tablet Commonly known as:  JANUVIA Take 100 mg by mouth daily.   TART CHERRY ADVANCED PO Take 1,200 mg by mouth.   TRESIBA FLEXTOUCH 100 UNIT/ML Sopn FlexTouch Pen Generic drug:  insulin degludec Inject into the skin daily.   vitamin B-12 1000 MCG tablet Commonly known as:  CYANOCOBALAMIN Take 1,000 mcg by mouth 2 (two) times a week.   ZOMIG 2.5 MG tablet Generic drug:  ZOLMitriptan Take 2.5 mg by mouth once. May repeat in 2 hours if headache persists or recurs.       Known medication allergies: Allergies  Allergen Reactions  . Dilantin  [Phenytoin Sodium Extended] Hives  . Amlodipine  Other reaction(s): NO REACTION  . Amoxicillin   . Erythromycin     "SKIN CRAWLING"  . Fluticasone   . Iodinated Diagnostic Agents   . Iohexol      Code: HIVES, Desc: pt states hive w/ct contrast, does fine w/13hr prep, Onset Date: [id number]  Code: HIVES, Desc: pt states hive w/ct contrast, does fine w/13hr prep, Onset Date: [id number]   . Lisinopril     NIGHT SWEATS, COUGH   . Penicillins   . Phentermine Hypertension  . Shellfish-Derived Products   . Sulfa Antibiotics   . Latex Hives and Rash     Physical examination: Blood pressure 122/82, pulse 87, resp. rate 18, height 5' 1.75" (1.568 m), weight (!) 319 lb 12.8 oz (145.1 kg).  General: Alert, interactive, in no acute distress. HEENT: PERRLA, TMs pearly gray, turbinates minimally edematous without discharge, post-pharynx non erythematous; hard palate with erythematous macule. Neck: Supple without lymphadenopathy. Lungs: Clear to auscultation without wheezing, rhonchi or rales. {no increased work of breathing. CV: Normal S1, S2 without murmurs. Abdomen: Nondistended, nontender. Skin: Low right leg with several ecchymotic macules. Extremities:  No clubbing, cyanosis or edema. Neuro:   Grossly intact.  Diagnositics/Labs:  Spirometry: FEV1: 2.06L 78%, FVC: 2.53L 76%  predicted.  Spirometry is slightly reduced today from previous study    Assessment and plan:   Allergic rhinitis with conjunctivitis    - Zyrtec 10mg  daily (make take additional dose if needed)    - Continue Singulair 10mg  in the evening    - change nasal spray to nasal atrovent 2 sprays each nostril up to 3-4 times a day as needed for control of nasal drainage    -Continue Pataday 1 drop each eye as needed for itchy/watery/red eyes (if your insurance covers [place] will order this option)    -Saline nasal wash each evening and as needed.    -nasal saline gel can be used daily to help moisturized the nose and help prevent nosebleeds   Mild intermittent asthma  - Use Xopenex 2 puffs every 4-6 hours as needed for cough, wheeze, shortness of breath  - Continue singulair Asthma control goals:   Full participation in all desired activities (may need albuterol before activity)  Albuterol use two time or less a week on average (not counting use with activity)  Cough interfering with sleep two time or less a month  Oral steroids no more than once a year  No hospitalizations   Food allergy   - Continue avoidance of shellfish and fish   - Epipen/Benadryl as needed for allergic reaction  Delayed drug eruption  - agree that recent urticarial rash likely was triggered by dilantin use as it improved with cessation.  Delayed drug eruption can occur at any point in taking a medication even months to years.  - symptoms improved after stopping dilantin and would avoid this medication going forward  - she will let us know if she re-develops urticarial lesions   Follow-up in 6 months or sooner if needed.  I appreciate the opportunity to take part in Delphia's care. Please do not hesitate to contact me with questions.  Sincerely,   Margo Aye, MD Allergy/Immunology [facility]

## 2018-04-21 NOTE — Patient Instructions (Addendum)
Allergic rhinoconjunctivitis   - Zyrtec 10mg  daily (make take additional dose if needed)    - Continue Singulair 10mg  in the evening    - change nasal spray to nasal atrovent 2 sprays each nostril up to 3-4 times a day as needed for control of nasal drainage    -Continue Pataday 1 drop each eye as needed for itchy/watery/red eyes (if your insurance covers [place] will order this option)    -Saline nasal wash each evening and as needed.    -nasal saline gel can be used daily to help moisturized the nose and help prevent nosebleeds   Mild intermittent asthma  - Use Xopenex 2 puffs every 4-6 hours as needed for cough, wheeze, shortness of breath  - Continue singulair Asthma control goals:   Full participation in all desired activities (may need albuterol before activity)  Albuterol use two time or less a week on average (not counting use with activity)  Cough interfering with sleep two time or less a month  Oral steroids no more than once a year  No hospitalizations   Food allergy   - Continue avoidance of shellfish and fish   - Epipen/Benadryl as needed for allergic reaction  Delayed drug eruption  - agree that recent urticarial rash likely was triggered by dilantin use.  Delayed drug eruption can occur at any point in taking a medication even months to years of taking medications  - symptoms improved after stopping dilantin and would avoid this medication going forward   Follow-up in 6 months or sooner if needed.

## 2018-04-22 ENCOUNTER — Other Ambulatory Visit: Payer: Self-pay | Admitting: *Deleted

## 2018-04-22 MED ORDER — EPINEPHRINE 0.3 MG/0.3ML IJ SOAJ: INTRAMUSCULAR | 4 refills | Status: AC

## 2018-04-27 ENCOUNTER — Ambulatory Visit: Payer: Managed Care, Other (non HMO) | Admitting: Allergy

## 2018-05-07 ENCOUNTER — Ambulatory Visit: Payer: Managed Care, Other (non HMO) | Admitting: Allergy

## 2018-05-27 ENCOUNTER — Telehealth: Payer: Self-pay | Admitting: Allergy

## 2018-05-27 NOTE — Telephone Encounter (Signed)
I do not prescribe Keflex as it is not an indicated antibiotic for sinus infections when there are more appropriate antibiotics not listed in her allergy list.  I treat sinus infections with broader spectrum antibiotics that cover bacteria of the respiratory tract and Keflex provides incomplete coverage.   Has she tried Cefdinir [place]) this would be a more appropriate option for sinus infection and is in the cephalosporin family with Keflex which is a 1st generation cephalosporin antibiotic vs Omnicef a 3rd generation cephalosporin antibiotic with better coverage.  Omicef is 300mg  twice a day x 10 days if she would like this antibiotic at this time.    symbicort is not a medication we have used before as her asthma had been under good control without an maintenance medication.  If SOB is not relieved with either albuterol or xopenex then next step would be an inhaled steroid medication like symbicort (which has long-acting antihistamine).

## 2018-05-27 NOTE — Telephone Encounter (Signed)
Patient is complaining of severe shortnes of breath, severe post nasal drainage that induces a cough, pressure in her upper jaw/teeth and bleeding gums.Patient denies fever, body aches or chills. She denies contact with ill patient or COVID-19 patient. Her symptoms have been ongoing since Friday. She is taking all of her medications as prescribed. Patient wanted me to remind Dr. Delorse Lek that in the event a prescription was sent in she is only able to take Keflex due to multiple medication allergies.

## 2018-05-27 NOTE — Telephone Encounter (Signed)
Patient states that the Avelox & Levaquin cause muscle pains. She is very adamant about the Keflex. She said that she does not understand why her pcp and other docs will prescribe it for t=similar symptoms that she is having now and it works while Dr. Delorse Lek will not. I informed her that Dr. Delorse Lek is the doctor that I work for and if she says that she will not prescribe it then we can not. She also told me that she is going to try the Xopenex a few more times.   I do not see Symbicort in her chart. Can you please advise on dosing instructions?

## 2018-05-27 NOTE — Telephone Encounter (Signed)
Per verbal conversation with patient she has been informed to go to primary care to be seen if this is the specific medication that she wants. I reiterated that Dr. Delorse Lek said that Keflex is not appropriate. Patient verbalized understanding and will contact PCP tomorrow. I did let her know that the Symbicort would be sent tomorrow pending instructions from Dr. Delorse Lek.

## 2018-05-27 NOTE — Telephone Encounter (Signed)
Ok that is fine however Keflex is not an appropriate antibiotic to treat bacteria of the sinus tract and thus would not recommend it at this time.  Another appropriate antibiotic option for sinus infection is Avelox or Levaquin (both are flouroquinolones; if she does not have an allergy and has taken either of these before this is an option).    If she feels albuterol was more effective she can go back to this medication however she reports it has made her feel more jittery and wired with albuterol use.

## 2018-05-27 NOTE — Telephone Encounter (Signed)
Left message for patient to call back to [place] office.

## 2018-05-27 NOTE — Telephone Encounter (Signed)
Patient also wanted to add that she is on [place] injection. She wonders if her symptoms may be related to this. She started back when she was seen by Dr. Delorse Lek on 15 units then went to 20 and now at 30. I let her know that if she had specific questions about that medication then she would need to contact the prescribing provider. I let her know Dr. Delorse Lek would get these messages and we would be back in touch.

## 2018-05-27 NOTE — Telephone Encounter (Signed)
This sounds like an acute sinusitis.  She needs to monitor closely for fevers and if she develops fever she should have Covid testing and we can order this for her to have done at one of the tenting test sites or if symptoms worsen would recommend testing.      To treat sinusitis, Doxycycline would be more appropriate antibiotic for respiratory tract infections than Keflex.  Has she ever had Doxycycline? Any side effects?    She should also utilize nasal saline rinses if she is able to tolerate this.    With SOB is the Xopenex helping to relieve this?  She would benefit from steroids (prednisone) however this would make her blood sugars rise which would like to avoid.    Thus would recommend she use Symbicort to help with SOB at this time to avoid systemic steroid.    In regards to [place] I do not think her sinus symptoms are related to [place] however she should discuss this with the prescribing doctor as you stated.

## 2018-05-27 NOTE — Telephone Encounter (Signed)
No she has not had doxycycline. Monique Fisher is not comfortable with taking something that she is uncomfortable taking something that she has not had before.  She told me that she is not sure if the Xopenex is working as well as the regular albuterol inhaler. She told me that after walking from her office to the car which is a very long walk she did become light headed and "sleepy" after using the inhaler.

## 2018-05-27 NOTE — Telephone Encounter (Signed)
Patient is calling in due to not feeling well NO FEVER Teeth sore, sinus pressure, congestion Has had to use her inhaler more due to shortness of breath  What can she do??

## 2018-05-28 ENCOUNTER — Telehealth: Payer: Self-pay | Admitting: Allergy

## 2018-05-28 MED ORDER — CEFDINIR 300 MG PO CAPS: 300.0000 mg | ORAL_CAPSULE | Freq: Two times a day (BID) | ORAL | 0 refills | Status: AC

## 2018-05-28 MED ORDER — SYMBICORT 160-4.5 MCG/ACT IN AERO: 2.0000 | INHALATION_SPRAY | Freq: Two times a day (BID) | RESPIRATORY_TRACT | 5 refills | Status: AC

## 2018-05-28 NOTE — Telephone Encounter (Signed)
I called patient again. She did not answer so I left her the [place] office information to call back.

## 2018-05-28 NOTE — Telephone Encounter (Signed)
I have sent both prescriptions in to pharmacy in chart. The letter is ready to be signed by Dr. Delorse Lek. I did try to call patient but received a message stating that my call could not be completed as dialed.

## 2018-05-28 NOTE — Telephone Encounter (Signed)
Letter created. Pt notified. Letter placed in pended basket to be signed.

## 2018-05-28 NOTE — Addendum Note (Signed)
Addended by: Mliss Fritz I on: 05/28/2018 01:34 PM   Modules accepted: Orders

## 2018-05-28 NOTE — Telephone Encounter (Signed)
For the past three days or the next three days?

## 2018-05-28 NOTE — Telephone Encounter (Signed)
Patient did call back and I advised her of both Rx sent to pharmacy and to call [place] clinic in the am to see if note ready for her to pick up

## 2018-05-28 NOTE — Telephone Encounter (Signed)
Pt called about the letter that dr Delorse Lek is going to sign and needs a dr note for not been able to go to work. 03/18,03/19,03/20. [phone].

## 2018-05-28 NOTE — Telephone Encounter (Signed)
Generated work note for patient (see 3/19 phone note). Sent pt link via text message to sign up for mychart so that she may access letters. Holding copies of both letters to be signed and picked up or mailed on 05/29/18.

## 2018-05-28 NOTE — Telephone Encounter (Signed)
Patient would also like a letter permitting/requiring her to have access to parking closer to her office building. She says that this does not have to be a year round thing, just for when her active season is.

## 2018-05-28 NOTE — Telephone Encounter (Signed)
Forwarding to Kayla

## 2018-05-28 NOTE — Telephone Encounter (Signed)
In the event the patient calls back today.

## 2018-05-28 NOTE — Telephone Encounter (Signed)
Patient returned my call. Her cough is worse, her post nasal is worse, her shortness of breath is worse. She checked her temp, which was 98.5 orally. Patient has not contacted her primary care provider as of yet. She is okay with taking the Omnicef. She is okay with the Symbicort (would like 80 or 160? & puffs?). She tells me that she just feels like crap.

## 2018-05-28 NOTE — Telephone Encounter (Signed)
Omnicef 300mg  twice a day x 10 days.  symbicort 2 puffs twice a day.  -------------------- Put on office letterhead --   To whom it may concern,     ''insert patient name'' is under my care for asthma management.  Please allow "insert patient" to be able to park her vehicle in the closest parking spot(s) as possible to her office building to prevent increase in respiratory related symptoms due to excess walking to and from her vehicle.    Sincerely,   Margo Aye, MD

## 2018-05-28 NOTE — Telephone Encounter (Signed)
nvm I see it.

## 2018-05-29 ENCOUNTER — Telehealth: Payer: Self-pay | Admitting: Allergy

## 2018-06-02 NOTE — Telephone Encounter (Signed)
Letters generated. Pt notified. Requested signed copies be mailed out once provider signs. Letters placed in pended basket.

## 2018-06-02 NOTE — Telephone Encounter (Signed)
Pt called stating that she was unable to make it into work yesterday 06/01/18 and is requesting a work excuse extendeing from her time off last week. Advised pt that I would need to check with provider and get back with her. Please advise if letter is appropriate.

## 2018-06-02 NOTE — Telephone Encounter (Signed)
That is fine.   Please also provide with this letter in case she has a work from home option.   --------------------  To Whom It May Concern:  This letter is regarding my patient - Monique Fisher. She has asthma, and with the current COVID-19 pandemic, having asthma puts her at higher risk of having complications such as asthma flares (which may be potentially life-threatening) if she is infected. Besides the precautions that are already recommended by the [facility] of Health and the Centers for Disease Control and Prevention, I also advise that she be allowed to work from home, which would decrease the risk of infection. Currently, we do not know the length of these restrictions, but when we are notified by national, state, and local health departments that it is safe to resume normal work activity, she may resume normal work activity as well.   Thank you for your consideration and attention to this matter, and thank you also for helping [place] to stay safe by following the recommendations.   Sincerely,   Margo Aye, MD [facility]

## 2018-06-04 NOTE — Telephone Encounter (Signed)
error 

## 2018-06-10 DEATH — deceased

## 2018-09-07 ENCOUNTER — Encounter: Payer: Managed Care, Other (non HMO) | Admitting: Obstetrics & Gynecology

## 2018-10-22 ENCOUNTER — Ambulatory Visit: Payer: Self-pay | Admitting: Allergy
